# Patient Record
Sex: Female | Born: 1955 | Race: White | Hispanic: No | Marital: Single | Smoking: Current every day smoker
Health system: Southern US, Community
[De-identification: ages and names within clinical notes are randomized; demographics above are authoritative.]

## PROBLEM LIST (undated history)

## (undated) DIAGNOSIS — J449 Chronic obstructive pulmonary disease, unspecified: Secondary | ICD-10-CM

## (undated) DIAGNOSIS — J9611 Chronic respiratory failure with hypoxia: Secondary | ICD-10-CM

## (undated) DIAGNOSIS — I509 Heart failure, unspecified: Secondary | ICD-10-CM

---

## 2018-04-04 DIAGNOSIS — M272 Inflammatory conditions of jaws: Secondary | ICD-10-CM | POA: Diagnosis not present

## 2018-06-13 DIAGNOSIS — I10 Essential (primary) hypertension: Secondary | ICD-10-CM | POA: Diagnosis not present

## 2018-06-13 DIAGNOSIS — Z6822 Body mass index (BMI) 22.0-22.9, adult: Secondary | ICD-10-CM | POA: Diagnosis not present

## 2018-06-13 DIAGNOSIS — J449 Chronic obstructive pulmonary disease, unspecified: Secondary | ICD-10-CM | POA: Diagnosis not present

## 2018-06-13 DIAGNOSIS — F064 Anxiety disorder due to known physiological condition: Secondary | ICD-10-CM | POA: Diagnosis not present

## 2018-08-29 DIAGNOSIS — Z6821 Body mass index (BMI) 21.0-21.9, adult: Secondary | ICD-10-CM | POA: Diagnosis not present

## 2018-08-29 DIAGNOSIS — S0911XA Strain of muscle and tendon of head, initial encounter: Secondary | ICD-10-CM | POA: Diagnosis not present

## 2018-09-10 DIAGNOSIS — Z0000 Encounter for general adult medical examination without abnormal findings: Secondary | ICD-10-CM | POA: Diagnosis not present

## 2018-09-10 DIAGNOSIS — Z6822 Body mass index (BMI) 22.0-22.9, adult: Secondary | ICD-10-CM | POA: Diagnosis not present

## 2018-09-11 DIAGNOSIS — M25551 Pain in right hip: Secondary | ICD-10-CM | POA: Diagnosis not present

## 2018-09-21 DIAGNOSIS — R921 Mammographic calcification found on diagnostic imaging of breast: Secondary | ICD-10-CM | POA: Diagnosis not present

## 2018-09-21 DIAGNOSIS — Z1231 Encounter for screening mammogram for malignant neoplasm of breast: Secondary | ICD-10-CM | POA: Diagnosis not present

## 2018-10-03 DIAGNOSIS — R921 Mammographic calcification found on diagnostic imaging of breast: Secondary | ICD-10-CM | POA: Diagnosis not present

## 2018-10-03 DIAGNOSIS — R928 Other abnormal and inconclusive findings on diagnostic imaging of breast: Secondary | ICD-10-CM | POA: Diagnosis not present

## 2018-10-11 DIAGNOSIS — M818 Other osteoporosis without current pathological fracture: Secondary | ICD-10-CM | POA: Diagnosis not present

## 2018-10-11 DIAGNOSIS — M810 Age-related osteoporosis without current pathological fracture: Secondary | ICD-10-CM | POA: Diagnosis not present

## 2018-12-10 DIAGNOSIS — M818 Other osteoporosis without current pathological fracture: Secondary | ICD-10-CM | POA: Diagnosis not present

## 2018-12-10 DIAGNOSIS — J440 Chronic obstructive pulmonary disease with acute lower respiratory infection: Secondary | ICD-10-CM | POA: Diagnosis not present

## 2018-12-10 DIAGNOSIS — I10 Essential (primary) hypertension: Secondary | ICD-10-CM | POA: Diagnosis not present

## 2018-12-10 DIAGNOSIS — F064 Anxiety disorder due to known physiological condition: Secondary | ICD-10-CM | POA: Diagnosis not present

## 2019-02-13 DIAGNOSIS — J449 Chronic obstructive pulmonary disease, unspecified: Secondary | ICD-10-CM | POA: Diagnosis not present

## 2019-02-13 DIAGNOSIS — Z9981 Dependence on supplemental oxygen: Secondary | ICD-10-CM | POA: Diagnosis not present

## 2019-02-13 DIAGNOSIS — S2231XA Fracture of one rib, right side, initial encounter for closed fracture: Secondary | ICD-10-CM | POA: Diagnosis not present

## 2019-02-13 DIAGNOSIS — Z79899 Other long term (current) drug therapy: Secondary | ICD-10-CM | POA: Diagnosis not present

## 2019-02-13 DIAGNOSIS — Z79891 Long term (current) use of opiate analgesic: Secondary | ICD-10-CM | POA: Diagnosis not present

## 2019-02-13 DIAGNOSIS — J441 Chronic obstructive pulmonary disease with (acute) exacerbation: Secondary | ICD-10-CM | POA: Diagnosis not present

## 2019-02-13 DIAGNOSIS — Z87891 Personal history of nicotine dependence: Secondary | ICD-10-CM | POA: Diagnosis not present

## 2019-02-13 DIAGNOSIS — R05 Cough: Secondary | ICD-10-CM | POA: Diagnosis not present

## 2019-02-13 DIAGNOSIS — I10 Essential (primary) hypertension: Secondary | ICD-10-CM | POA: Diagnosis not present

## 2019-03-11 DIAGNOSIS — I10 Essential (primary) hypertension: Secondary | ICD-10-CM | POA: Diagnosis not present

## 2019-03-11 DIAGNOSIS — F064 Anxiety disorder due to known physiological condition: Secondary | ICD-10-CM | POA: Diagnosis not present

## 2019-03-11 DIAGNOSIS — J440 Chronic obstructive pulmonary disease with acute lower respiratory infection: Secondary | ICD-10-CM | POA: Diagnosis not present

## 2019-03-11 DIAGNOSIS — M818 Other osteoporosis without current pathological fracture: Secondary | ICD-10-CM | POA: Diagnosis not present

## 2019-06-10 DIAGNOSIS — Z6822 Body mass index (BMI) 22.0-22.9, adult: Secondary | ICD-10-CM | POA: Diagnosis not present

## 2019-06-10 DIAGNOSIS — M818 Other osteoporosis without current pathological fracture: Secondary | ICD-10-CM | POA: Diagnosis not present

## 2019-06-10 DIAGNOSIS — J449 Chronic obstructive pulmonary disease, unspecified: Secondary | ICD-10-CM | POA: Diagnosis not present

## 2019-06-10 DIAGNOSIS — I10 Essential (primary) hypertension: Secondary | ICD-10-CM | POA: Diagnosis not present

## 2019-06-10 DIAGNOSIS — F064 Anxiety disorder due to known physiological condition: Secondary | ICD-10-CM | POA: Diagnosis not present

## 2019-07-08 DIAGNOSIS — J029 Acute pharyngitis, unspecified: Secondary | ICD-10-CM | POA: Diagnosis not present

## 2019-07-08 DIAGNOSIS — Z6821 Body mass index (BMI) 21.0-21.9, adult: Secondary | ICD-10-CM | POA: Diagnosis not present

## 2019-07-19 DIAGNOSIS — S79912A Unspecified injury of left hip, initial encounter: Secondary | ICD-10-CM | POA: Diagnosis not present

## 2019-07-19 DIAGNOSIS — W010XXA Fall on same level from slipping, tripping and stumbling without subsequent striking against object, initial encounter: Secondary | ICD-10-CM | POA: Diagnosis not present

## 2019-07-19 DIAGNOSIS — J441 Chronic obstructive pulmonary disease with (acute) exacerbation: Secondary | ICD-10-CM | POA: Diagnosis not present

## 2019-07-19 DIAGNOSIS — S7002XA Contusion of left hip, initial encounter: Secondary | ICD-10-CM | POA: Diagnosis not present

## 2019-07-19 DIAGNOSIS — Z209 Contact with and (suspected) exposure to unspecified communicable disease: Secondary | ICD-10-CM | POA: Diagnosis not present

## 2019-07-19 DIAGNOSIS — Z87891 Personal history of nicotine dependence: Secondary | ICD-10-CM | POA: Diagnosis not present

## 2019-07-19 DIAGNOSIS — R52 Pain, unspecified: Secondary | ICD-10-CM | POA: Diagnosis not present

## 2019-07-19 DIAGNOSIS — M25552 Pain in left hip: Secondary | ICD-10-CM | POA: Diagnosis not present

## 2019-07-19 DIAGNOSIS — Z79899 Other long term (current) drug therapy: Secondary | ICD-10-CM | POA: Diagnosis not present

## 2019-07-19 DIAGNOSIS — I10 Essential (primary) hypertension: Secondary | ICD-10-CM | POA: Diagnosis not present

## 2019-07-19 DIAGNOSIS — R0902 Hypoxemia: Secondary | ICD-10-CM | POA: Diagnosis not present

## 2019-07-25 DIAGNOSIS — R6884 Jaw pain: Secondary | ICD-10-CM | POA: Diagnosis not present

## 2019-09-09 DIAGNOSIS — J449 Chronic obstructive pulmonary disease, unspecified: Secondary | ICD-10-CM | POA: Diagnosis not present

## 2019-09-09 DIAGNOSIS — Z6822 Body mass index (BMI) 22.0-22.9, adult: Secondary | ICD-10-CM | POA: Diagnosis not present

## 2019-09-09 DIAGNOSIS — F411 Generalized anxiety disorder: Secondary | ICD-10-CM | POA: Diagnosis not present

## 2019-09-09 DIAGNOSIS — E782 Mixed hyperlipidemia: Secondary | ICD-10-CM | POA: Diagnosis not present

## 2019-12-02 DIAGNOSIS — Z6821 Body mass index (BMI) 21.0-21.9, adult: Secondary | ICD-10-CM | POA: Diagnosis not present

## 2019-12-02 DIAGNOSIS — F411 Generalized anxiety disorder: Secondary | ICD-10-CM | POA: Diagnosis not present

## 2019-12-02 DIAGNOSIS — E7849 Other hyperlipidemia: Secondary | ICD-10-CM | POA: Diagnosis not present

## 2019-12-02 DIAGNOSIS — J449 Chronic obstructive pulmonary disease, unspecified: Secondary | ICD-10-CM | POA: Diagnosis not present

## 2020-09-03 ENCOUNTER — Other Ambulatory Visit (HOSPITAL_COMMUNITY): Payer: Self-pay | Admitting: Internal Medicine

## 2021-03-02 ENCOUNTER — Telehealth: Payer: Self-pay | Admitting: Internal Medicine

## 2021-03-02 NOTE — Telephone Encounter (Signed)
Cardiology Transfer Request/Teleconsult  Call received from Dr. Gorden Harms, [facility] ED 9:55pm, 03/02/21  65 year old with severe COPD with associated PH/cor pulmonale and chronic respiratory failure on 3L O2 continuous. She also is benzodiazapine dependant with a history of excessive use. Transported to the [facility] ED tonight with respiratory distress. Refused BiPAP but improved significantly en route with nebs and solumedrol. She had a second episode in the ED, significant respiratory distress with cyanosis, wheezing. Again responded well to nebs.   I am told her ECG had chronic changes consistent with right heart strain. She has not had any chest pain. A troponin was sent that was around 1000 initially and is down-trending. She had a pro-BNP of 54k, but I am told no JVD, no lower extremity edema, no pulmonary edema on CXR, and bilirubin is normal. Echo report from 01/21/21 reviewed which notes a severely dilated RV with moderately reduced systolic function, moderate TR and PR, mild PH, elevated RAP. She has been given a dose of IV lasix as well.   Her troponin is consistent with her acute respiratory failure presentation, particularly in the context of significant chronic right heart failure. Downtrending troponin without chest pain or ischemic ECG changes not consistent with ACS. She does not have other clinical evidence of decompensated right heart failure aside from her BNP, which is highly labile, currently within historical range. Additionally, her response to steroid/bronchodilator is not suggestive of a primary cardiac etiology for her presentation.    Recommend hospitalist admission for COPD exacerbation. Consider IV diuretics while admitted if clinical evidence of acute right heart failure/hypervolemia. Can also consider referral to pulmonary hypertension clinic at discharge for consideration of Tyvaso if no history of pulmonary edema, though unlikely to be of significant benefit at this  point given her degree of RV dysfunction.

## 2021-03-03 ENCOUNTER — Encounter (HOSPITAL_COMMUNITY): Payer: Self-pay | Admitting: Internal Medicine

## 2021-03-03 ENCOUNTER — Inpatient Hospital Stay (HOSPITAL_COMMUNITY)
Admission: EM | Admit: 2021-03-03 | Discharge: 2021-03-19 | DRG: 291 | Disposition: E | Payer: 59 | Source: Other Acute Inpatient Hospital | Attending: Pulmonary Disease | Admitting: Pulmonary Disease

## 2021-03-03 DIAGNOSIS — Z9981 Dependence on supplemental oxygen: Secondary | ICD-10-CM

## 2021-03-03 DIAGNOSIS — F17210 Nicotine dependence, cigarettes, uncomplicated: Secondary | ICD-10-CM | POA: Diagnosis present

## 2021-03-03 DIAGNOSIS — R230 Cyanosis: Secondary | ICD-10-CM | POA: Diagnosis not present

## 2021-03-03 DIAGNOSIS — E872 Acidosis: Secondary | ICD-10-CM | POA: Diagnosis present

## 2021-03-03 DIAGNOSIS — F1320 Sedative, hypnotic or anxiolytic dependence, uncomplicated: Secondary | ICD-10-CM | POA: Diagnosis present

## 2021-03-03 DIAGNOSIS — I2781 Cor pulmonale (chronic): Secondary | ICD-10-CM | POA: Diagnosis not present

## 2021-03-03 DIAGNOSIS — Z452 Encounter for adjustment and management of vascular access device: Secondary | ICD-10-CM

## 2021-03-03 DIAGNOSIS — Z515 Encounter for palliative care: Secondary | ICD-10-CM

## 2021-03-03 DIAGNOSIS — Z801 Family history of malignant neoplasm of trachea, bronchus and lung: Secondary | ICD-10-CM

## 2021-03-03 DIAGNOSIS — J9622 Acute and chronic respiratory failure with hypercapnia: Secondary | ICD-10-CM | POA: Diagnosis present

## 2021-03-03 DIAGNOSIS — I5033 Acute on chronic diastolic (congestive) heart failure: Secondary | ICD-10-CM | POA: Diagnosis present

## 2021-03-03 DIAGNOSIS — I472 Ventricular tachycardia: Secondary | ICD-10-CM | POA: Diagnosis not present

## 2021-03-03 DIAGNOSIS — R509 Fever, unspecified: Secondary | ICD-10-CM | POA: Diagnosis not present

## 2021-03-03 DIAGNOSIS — R319 Hematuria, unspecified: Secondary | ICD-10-CM | POA: Diagnosis not present

## 2021-03-03 DIAGNOSIS — J441 Chronic obstructive pulmonary disease with (acute) exacerbation: Secondary | ICD-10-CM | POA: Diagnosis not present

## 2021-03-03 DIAGNOSIS — R451 Restlessness and agitation: Secondary | ICD-10-CM | POA: Diagnosis not present

## 2021-03-03 DIAGNOSIS — G9341 Metabolic encephalopathy: Secondary | ICD-10-CM | POA: Diagnosis not present

## 2021-03-03 DIAGNOSIS — T380X5A Adverse effect of glucocorticoids and synthetic analogues, initial encounter: Secondary | ICD-10-CM | POA: Diagnosis not present

## 2021-03-03 DIAGNOSIS — I272 Pulmonary hypertension, unspecified: Secondary | ICD-10-CM | POA: Diagnosis present

## 2021-03-03 DIAGNOSIS — R4020 Unspecified coma: Secondary | ICD-10-CM | POA: Diagnosis not present

## 2021-03-03 DIAGNOSIS — Z79899 Other long term (current) drug therapy: Secondary | ICD-10-CM

## 2021-03-03 DIAGNOSIS — J9621 Acute and chronic respiratory failure with hypoxia: Secondary | ICD-10-CM | POA: Diagnosis present

## 2021-03-03 DIAGNOSIS — J9690 Respiratory failure, unspecified, unspecified whether with hypoxia or hypercapnia: Secondary | ICD-10-CM

## 2021-03-03 DIAGNOSIS — I110 Hypertensive heart disease with heart failure: Secondary | ICD-10-CM | POA: Diagnosis not present

## 2021-03-03 DIAGNOSIS — R339 Retention of urine, unspecified: Secondary | ICD-10-CM | POA: Diagnosis present

## 2021-03-03 DIAGNOSIS — J9611 Chronic respiratory failure with hypoxia: Secondary | ICD-10-CM | POA: Diagnosis not present

## 2021-03-03 DIAGNOSIS — R34 Anuria and oliguria: Secondary | ICD-10-CM | POA: Diagnosis not present

## 2021-03-03 DIAGNOSIS — R579 Shock, unspecified: Secondary | ICD-10-CM | POA: Diagnosis not present

## 2021-03-03 DIAGNOSIS — R739 Hyperglycemia, unspecified: Secondary | ICD-10-CM | POA: Diagnosis not present

## 2021-03-03 DIAGNOSIS — Z5320 Procedure and treatment not carried out because of patient's decision for unspecified reasons: Secondary | ICD-10-CM | POA: Diagnosis not present

## 2021-03-03 DIAGNOSIS — I248 Other forms of acute ischemic heart disease: Secondary | ICD-10-CM | POA: Diagnosis present

## 2021-03-03 DIAGNOSIS — E873 Alkalosis: Secondary | ICD-10-CM | POA: Diagnosis present

## 2021-03-03 DIAGNOSIS — I959 Hypotension, unspecified: Secondary | ICD-10-CM | POA: Diagnosis not present

## 2021-03-03 DIAGNOSIS — N179 Acute kidney failure, unspecified: Secondary | ICD-10-CM | POA: Diagnosis present

## 2021-03-03 DIAGNOSIS — Z66 Do not resuscitate: Secondary | ICD-10-CM | POA: Diagnosis not present

## 2021-03-03 DIAGNOSIS — I462 Cardiac arrest due to underlying cardiac condition: Secondary | ICD-10-CM | POA: Diagnosis not present

## 2021-03-03 DIAGNOSIS — Z0189 Encounter for other specified special examinations: Secondary | ICD-10-CM

## 2021-03-03 DIAGNOSIS — I5043 Acute on chronic combined systolic (congestive) and diastolic (congestive) heart failure: Secondary | ICD-10-CM | POA: Diagnosis present

## 2021-03-03 DIAGNOSIS — Z9289 Personal history of other medical treatment: Secondary | ICD-10-CM

## 2021-03-03 DIAGNOSIS — Z781 Physical restraint status: Secondary | ICD-10-CM

## 2021-03-03 DIAGNOSIS — J439 Emphysema, unspecified: Secondary | ICD-10-CM | POA: Diagnosis present

## 2021-03-03 DIAGNOSIS — R0902 Hypoxemia: Secondary | ICD-10-CM

## 2021-03-03 DIAGNOSIS — E785 Hyperlipidemia, unspecified: Secondary | ICD-10-CM | POA: Diagnosis present

## 2021-03-03 HISTORY — DX: Chronic obstructive pulmonary disease, unspecified: J44.9

## 2021-03-03 HISTORY — DX: Heart failure, unspecified: I50.9

## 2021-03-03 HISTORY — DX: Chronic respiratory failure with hypoxia: J96.11

## 2021-03-03 LAB — COMPREHENSIVE METABOLIC PANEL
ALT: 37 U/L (ref 0–44)
AST: 31 U/L (ref 15–41)
Albumin: 3.4 g/dL — ABNORMAL LOW (ref 3.5–5.0)
Alkaline Phosphatase: 202 U/L — ABNORMAL HIGH (ref 38–126)
Anion gap: 9 (ref 5–15)
BUN: 40 mg/dL — ABNORMAL HIGH (ref 8–23)
CO2: 36 mmol/L — ABNORMAL HIGH (ref 22–32)
Calcium: 8.6 mg/dL — ABNORMAL LOW (ref 8.9–10.3)
Chloride: 98 mmol/L (ref 98–111)
Creatinine, Ser: 1.25 mg/dL — ABNORMAL HIGH (ref 0.44–1.00)
GFR, Estimated: 48 mL/min — ABNORMAL LOW (ref >60)
Glucose, Bld: 101 mg/dL — ABNORMAL HIGH (ref 70–99)
Potassium: 3.7 mmol/L (ref 3.5–5.1)
Sodium: 143 mmol/L (ref 135–145)
Total Bilirubin: 1.1 mg/dL (ref 0.3–1.2)
Total Protein: 6.1 g/dL — ABNORMAL LOW (ref 6.5–8.1)

## 2021-03-03 LAB — CBC
HCT: 40.7 % (ref 36.0–46.0)
Hemoglobin: 11.9 g/dL — ABNORMAL LOW (ref 12.0–15.0)
MCH: 26.5 pg (ref 26.0–34.0)
MCHC: 29.2 g/dL — ABNORMAL LOW (ref 30.0–36.0)
MCV: 90.6 fL (ref 80.0–100.0)
Platelets: 247 10*3/uL (ref 150–400)
RBC: 4.49 MIL/uL (ref 3.87–5.11)
RDW: 22.5 % — ABNORMAL HIGH (ref 11.5–15.5)
WBC: 10.1 10*3/uL (ref 4.0–10.5)
nRBC: 0 % (ref 0.0–0.2)

## 2021-03-03 LAB — TROPONIN I (HIGH SENSITIVITY)
Troponin I (High Sensitivity): 720 ng/L (ref <18)
Troponin I (High Sensitivity): 679 ng/L (ref <18)

## 2021-03-03 LAB — MAGNESIUM: Magnesium: 2.1 mg/dL (ref 1.7–2.4)

## 2021-03-03 LAB — PHOSPHORUS: Phosphorus: 3.9 mg/dL (ref 2.5–4.6)

## 2021-03-03 LAB — BRAIN NATRIURETIC PEPTIDE: B Natriuretic Peptide: >4500 pg/mL — ABNORMAL HIGH (ref 0.0–100.0)

## 2021-03-03 MED ORDER — MOMETASONE FURO-FORMOTEROL FUM 200-5 MCG/ACT IN AERO
2.0000 | INHALATION_SPRAY | Freq: Two times a day (BID) | RESPIRATORY_TRACT | Status: DC
  Filled 2021-03-03: qty 8.8

## 2021-03-03 MED ORDER — UMECLIDINIUM BROMIDE 62.5 MCG/INH IN AEPB
1.0000 | INHALATION_SPRAY | Freq: Every day | RESPIRATORY_TRACT | Status: DC
  Filled 2021-03-03: qty 7

## 2021-03-03 MED ORDER — LEVALBUTEROL HCL 0.63 MG/3ML IN NEBU
0.6300 mg | INHALATION_SOLUTION | Freq: Four times a day (QID) | RESPIRATORY_TRACT | Status: DC | PRN
  Administered 2021-03-03 – 2021-03-08 (×2): 0.63 mg via RESPIRATORY_TRACT
  Filled 2021-03-03 (×2): qty 3

## 2021-03-03 MED ORDER — NICOTINE 21 MG/24HR TD PT24
21.0000 mg | MEDICATED_PATCH | Freq: Every day | TRANSDERMAL | Status: DC
  Administered 2021-03-03 – 2021-03-05 (×3): 21 mg via TRANSDERMAL
  Filled 2021-03-03 (×3): qty 1

## 2021-03-03 MED ORDER — THIAMINE HCL 100 MG PO TABS
100.0000 mg | ORAL_TABLET | Freq: Every day | ORAL | Status: DC
  Administered 2021-03-03 – 2021-03-05 (×2): 100 mg via ORAL
  Filled 2021-03-03 (×2): qty 1

## 2021-03-03 MED ORDER — PREDNISONE 20 MG PO TABS: 40.0000 mg | ORAL_TABLET | Freq: Every day | ORAL | Status: DC

## 2021-03-03 MED ORDER — ADULT MULTIVITAMIN W/MINERALS CH
1.0000 | ORAL_TABLET | Freq: Every day | ORAL | Status: DC
  Administered 2021-03-03 – 2021-03-05 (×2): 1 via ORAL
  Filled 2021-03-03 (×2): qty 1

## 2021-03-03 MED ORDER — LORAZEPAM 2 MG/ML IJ SOLN
1.0000 mg | INTRAMUSCULAR | Status: DC | PRN
  Administered 2021-03-04: 1 mg via INTRAVENOUS
  Administered 2021-03-04: 2 mg via INTRAVENOUS
  Administered 2021-03-04: 0.5 mg via INTRAVENOUS
  Administered 2021-03-04: 4 mg via INTRAVENOUS
  Filled 2021-03-03 (×3): qty 1
  Filled 2021-03-03: qty 2

## 2021-03-03 MED ORDER — THIAMINE HCL 100 MG/ML IJ SOLN: 100.0000 mg | Freq: Every day | INTRAMUSCULAR | Status: DC

## 2021-03-03 MED ORDER — FOLIC ACID 1 MG PO TABS
1.0000 mg | ORAL_TABLET | Freq: Every day | ORAL | Status: DC
  Administered 2021-03-03 – 2021-03-05 (×2): 1 mg via ORAL
  Filled 2021-03-03 (×2): qty 1

## 2021-03-03 MED ORDER — FUROSEMIDE 10 MG/ML IJ SOLN: 40.0000 mg | Freq: Every day | INTRAMUSCULAR | Status: DC

## 2021-03-03 MED ORDER — LORAZEPAM 1 MG PO TABS
1.0000 mg | ORAL_TABLET | ORAL | Status: DC | PRN
  Administered 2021-03-03: 1 mg via ORAL
  Filled 2021-03-03: qty 2

## 2021-03-03 MED ORDER — ENOXAPARIN SODIUM 40 MG/0.4ML ~~LOC~~ SOLN
40.0000 mg | SUBCUTANEOUS | Status: DC
  Administered 2021-03-03: 40 mg via SUBCUTANEOUS
  Filled 2021-03-03: qty 0.4

## 2021-03-03 NOTE — H&P (Signed)
History and Physical    Cassandra Riley RFX:[id number] DOB: 1956-04-03 DOA: 02/23/2021  PCP: Neale Burly, MD  Patient coming from: [facility] transfer  I have personally briefly reviewed patient's old medical records in [place]  Chief Complaint: Respiratory distress  HPI: Cassandra Riley is a 65 y.o. 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  Most recent 2d echo was 01/21/2021.  Pt had quit smoking in 2017, but resumed smoking ~3 months ago as well as abusing benzos after family member deaths.  Pt presented [facility]/15 to [facility] with respiratory distress.  Reportedly refused BIPAP but improved significantly with nebs and solumedrol.  Had second episode in ED that responded to nebs.  Pt also found to have CHF decompensation with ProBNP of 54k, no JVD, no extremity edema, did have pulm edema on CXR.  Given 45m IV lasix.  Initial HS trop of 1k, down trending with repeats in the 900s.  No CP.  [facility] with T wave findings.  [facility] as well as our cards fellow) were less impressed for primary cardiac event.  Felt that EKGs and trop elevations were c/w respiratory disease.  Neither cards fellow recd a heparin gtt.  [facility] of [facility] if needed[facility]  [facility] just slight chest tightness, breathing overall is significantly improved compared to last night.  Has foley in place: apparently has had urinary retention prior to hospital presentation for a couple of days.  No respiratory distress at this time, is on her baseline 3L.   Review of Systems: As per HPI, otherwise all review of systems negative.  Past Medical History:  Diagnosis Date  . CHF (congestive heart failure) ([place]   . Chronic respiratory failure with  hypoxia (HCC)    3L  . COPD (chronic obstructive pulmonary disease) ([place]     History reviewed. No pertinent surgical history.   reports that she has been smoking cigarettes. She does not have any smokeless tobacco history on file. She reports current drug use. Drug: Benzodiazepines. She reports that she does not drink alcohol.  Allergies  Allergen Reactions  . Morphine And Related Nausea And Vomiting    Family History  Problem Relation Age of Onset  . Lung cancer Sister      Prior to Admission medications   Not on File    Physical Exam: Vitals:   02/28/2021 1947  BP: 112/77  Pulse: (!) 113  Resp: 16  Temp: 98.6 F (37 C)  TempSrc: Oral  SpO2: 95%  Weight: 50.5 kg  Height: _0  (1.575 m)    Constitutional: NAD, calm, comfortable Eyes: PERRL, lids and conjunctivae normal ENMT: Mucous membranes are moist. Posterior pharynx clear of any exudate or lesions.Normal dentition.  Neck: normal, supple, no masses, no thyromegaly Respiratory: Coarse BS Cardiovascular: Tachycardic Abdomen: no tenderness, no masses palpated. No hepatosplenomegaly. Bowel sounds positive.  Musculoskeletal: no clubbing / cyanosis. No joint deformity upper and lower extremities. Good ROM, no contractures. Normal muscle tone.  Skin: no rashes, lesions, ulcers. No induration Neurologic: CN 2-12 grossly intact. Sensation intact, DTR normal. Strength 5/5 in all 4.  Psychiatric: Normal judgment and insight. Alert and oriented x 3. Normal mood.    Labs on Admission: I have personally reviewed following  labs and imaging studies  CBC: No results for input(s): WBC, NEUTROABS, HGB, HCT, MCV, PLT in the last 168 hours. Basic Metabolic Panel: No results for input(s): NA, K, CL, CO2, GLUCOSE, BUN, CREATININE, CALCIUM, MG, PHOS in the last 168 hours. GFR: CrCl cannot be calculated (No successful lab value found.). Liver Function Tests: No results for input(s): AST, ALT, ALKPHOS, BILITOT, PROT, ALBUMIN in  the last 168 hours. No results for input(s): LIPASE, AMYLASE in the last 168 hours. No results for input(s): AMMONIA in the last 168 hours. Coagulation Profile: No results for input(s): INR, PROTIME in the last 168 hours. Cardiac Enzymes: No results for input(s): CKTOTAL, CKMB, CKMBINDEX, TROPONINI in the last 168 hours. BNP (last 3 results) No results for input(s): PROBNP in the last 8760 hours. HbA1C: No results for input(s): HGBA1C in the last 72 hours. CBG: No results for input(s): GLUCAP in the last 168 hours. Lipid Profile: No results for input(s): CHOL, HDL, LDLCALC, TRIG, CHOLHDL, LDLDIRECT in the last 72 hours. Thyroid Function Tests: No results for input(s): TSH, T4TOTAL, FREET4, T3FREE, THYROIDAB in the last 72 hours. Anemia Panel: No results for input(s): VITAMINB12, FOLATE, FERRITIN, TIBC, IRON, RETICCTPCT in the last 72 hours. Urine analysis: No results found for: COLORURINE, APPEARANCEUR, LABSPEC, PHURINE, GLUCOSEU, HGBUR, BILIRUBINUR, KETONESUR, PROTEINUR, UROBILINOGEN, NITRITE, LEUKOCYTESUR  Radiological Exams on Admission: No results found.  EKG: Independently reviewed.  Assessment/Plan Principal Problem:   Acute on chronic diastolic CHF (congestive heart failure) (HCC) Active Problems:   Chronic respiratory failure with hypoxia (HCC)   COPD with acute exacerbation ([place])    1. Acute on chronic diastolic and R sided CHF - 1. CHF pathway 2. Lasix 39m IV daily for the moment 3. Tele monitor 4. Checking serial trops 5. Message sent to P. Trent for cards eval in AM 6. [facility], sounded less impressed for primary cardiac event (type 1 MI). 1. Neither cards fellow recommended heparin gtt 7. BMP daily 2. COPD with acute exacerbation - 1. COPD pathway 2. Prednisone 3. LABA/LAMA/inh steroid 4. Emphasized smoking cessation 5. PRN SABA (try xopenex because she already has S.Tach). 3. Chronic respiratory failure with  hypoxia and hypercapnea - 1. Cont 3L O2 via [place] 4. Benzodiazepine dependence / abuse - 1. CIWA to prevent withdrawal while here  DVT prophylaxis: Lovenox Code Status: Full Family Communication: No family in room Disposition Plan: Home after breathing improved and cleared by cards Consults called: Message sent to P. Trent for cards eval in AM [place]   Yulanda Diggs, JMedicine ParkHospitalists  How to contact the TCoast Surgery CenterAttending or Consulting provider [place] covering provider during after hours [place] for this patient?  1. Check the care team in [facility] Centerand look for a) attending/consulting TRH provider listed and b) the TAslaska Surgery Centerteam listed 2. Log into [email or web address]  Amion Physician Scheduling and messaging for groups and whole hospitals  On call and physician scheduling software for group practices, residents, hospitalists and other medical providers for call, clinic, rotation and shift schedules. OnCall Enterprise is a hospital-wide system for scheduling doctors and paging doctors on call. EasyPlot is for scientific plotting and data analysis.  [email or web address]  and use [place]'s universal password to access. If you do not have the password, please contact the hospital operator.  3. Locate the TNorth Georgia Medical Centerprovider you are looking for under [facility] and page to a number that you can be  directly reached. 4. If you still have difficulty reaching the provider, please page the [facility] (Director on Call) for the Hospitalists listed on amion for assistance.  02/20/2021, 9:07 PM

## 2021-03-04 ENCOUNTER — Observation Stay (HOSPITAL_COMMUNITY): Payer: 59

## 2021-03-04 ENCOUNTER — Inpatient Hospital Stay (HOSPITAL_COMMUNITY): Payer: 59

## 2021-03-04 DIAGNOSIS — I5043 Acute on chronic combined systolic (congestive) and diastolic (congestive) heart failure: Secondary | ICD-10-CM | POA: Diagnosis present

## 2021-03-04 DIAGNOSIS — I2781 Cor pulmonale (chronic): Secondary | ICD-10-CM | POA: Diagnosis present

## 2021-03-04 DIAGNOSIS — I462 Cardiac arrest due to underlying cardiac condition: Secondary | ICD-10-CM | POA: Diagnosis not present

## 2021-03-04 DIAGNOSIS — R34 Anuria and oliguria: Secondary | ICD-10-CM | POA: Diagnosis not present

## 2021-03-04 DIAGNOSIS — N179 Acute kidney failure, unspecified: Secondary | ICD-10-CM | POA: Diagnosis present

## 2021-03-04 DIAGNOSIS — I472 Ventricular tachycardia: Secondary | ICD-10-CM | POA: Diagnosis not present

## 2021-03-04 DIAGNOSIS — J439 Emphysema, unspecified: Secondary | ICD-10-CM | POA: Diagnosis present

## 2021-03-04 DIAGNOSIS — Z66 Do not resuscitate: Secondary | ICD-10-CM | POA: Diagnosis not present

## 2021-03-04 DIAGNOSIS — I2609 Other pulmonary embolism with acute cor pulmonale: Secondary | ICD-10-CM | POA: Diagnosis not present

## 2021-03-04 DIAGNOSIS — F1320 Sedative, hypnotic or anxiolytic dependence, uncomplicated: Secondary | ICD-10-CM | POA: Diagnosis present

## 2021-03-04 DIAGNOSIS — R509 Fever, unspecified: Secondary | ICD-10-CM | POA: Diagnosis not present

## 2021-03-04 DIAGNOSIS — R9431 Abnormal electrocardiogram [ECG] [EKG]: Secondary | ICD-10-CM

## 2021-03-04 DIAGNOSIS — Z9981 Dependence on supplemental oxygen: Secondary | ICD-10-CM | POA: Diagnosis not present

## 2021-03-04 DIAGNOSIS — R451 Restlessness and agitation: Secondary | ICD-10-CM | POA: Diagnosis not present

## 2021-03-04 DIAGNOSIS — Z7189 Other specified counseling: Secondary | ICD-10-CM | POA: Diagnosis not present

## 2021-03-04 DIAGNOSIS — I5021 Acute systolic (congestive) heart failure: Secondary | ICD-10-CM | POA: Diagnosis not present

## 2021-03-04 DIAGNOSIS — G9341 Metabolic encephalopathy: Secondary | ICD-10-CM

## 2021-03-04 DIAGNOSIS — J9611 Chronic respiratory failure with hypoxia: Secondary | ICD-10-CM | POA: Diagnosis not present

## 2021-03-04 DIAGNOSIS — Z515 Encounter for palliative care: Secondary | ICD-10-CM | POA: Diagnosis not present

## 2021-03-04 DIAGNOSIS — I272 Pulmonary hypertension, unspecified: Secondary | ICD-10-CM | POA: Diagnosis present

## 2021-03-04 DIAGNOSIS — I110 Hypertensive heart disease with heart failure: Secondary | ICD-10-CM | POA: Diagnosis present

## 2021-03-04 DIAGNOSIS — I248 Other forms of acute ischemic heart disease: Secondary | ICD-10-CM | POA: Diagnosis present

## 2021-03-04 DIAGNOSIS — I5033 Acute on chronic diastolic (congestive) heart failure: Secondary | ICD-10-CM | POA: Diagnosis present

## 2021-03-04 DIAGNOSIS — R579 Shock, unspecified: Secondary | ICD-10-CM | POA: Diagnosis not present

## 2021-03-04 DIAGNOSIS — E872 Acidosis: Secondary | ICD-10-CM | POA: Diagnosis present

## 2021-03-04 DIAGNOSIS — J441 Chronic obstructive pulmonary disease with (acute) exacerbation: Secondary | ICD-10-CM | POA: Diagnosis not present

## 2021-03-04 DIAGNOSIS — E873 Alkalosis: Secondary | ICD-10-CM | POA: Diagnosis present

## 2021-03-04 DIAGNOSIS — R0902 Hypoxemia: Secondary | ICD-10-CM

## 2021-03-04 DIAGNOSIS — R4020 Unspecified coma: Secondary | ICD-10-CM | POA: Diagnosis present

## 2021-03-04 DIAGNOSIS — J9622 Acute and chronic respiratory failure with hypercapnia: Secondary | ICD-10-CM | POA: Diagnosis present

## 2021-03-04 DIAGNOSIS — R230 Cyanosis: Secondary | ICD-10-CM | POA: Diagnosis not present

## 2021-03-04 DIAGNOSIS — J9621 Acute and chronic respiratory failure with hypoxia: Secondary | ICD-10-CM | POA: Diagnosis present

## 2021-03-04 LAB — BLOOD GAS, VENOUS
Acid-Base Excess: 11.1 mmol/L — ABNORMAL HIGH (ref 0.0–2.0)
Bicarbonate: 38.9 mmol/L — ABNORMAL HIGH (ref 20.0–28.0)
FIO2: 100
O2 Saturation: 96.4 %
Patient temperature: 37
pCO2, Ven: 101 mmHg (ref 44.0–60.0)
pH, Ven: 7.211 — ABNORMAL LOW (ref 7.250–7.430)

## 2021-03-04 LAB — ECHOCARDIOGRAM COMPLETE
AR max vel: 2.56 cm2
AV Area VTI: 2.64 cm2
AV Area mean vel: 2.07 cm2
AV Mean grad: 3 mmHg
AV Peak grad: 4.3 mmHg
Ao pk vel: 1.04 m/s
Area-P 1/2: 2.76 cm2
Calc EF: 37.9 %
Height: 62 in
S' Lateral: 3.8 cm
Single Plane A2C EF: 35.6 %
Single Plane A4C EF: 36.2 %
Weight: 1791.9 oz

## 2021-03-04 LAB — POCT I-STAT 7, (LYTES, BLD GAS, ICA,H+H)
Acid-Base Excess: 20 mmol/L — ABNORMAL HIGH (ref 0.0–2.0)
Bicarbonate: 46.3 mmol/L — ABNORMAL HIGH (ref 20.0–28.0)
Calcium, Ion: 1.11 mmol/L — ABNORMAL LOW (ref 1.15–1.40)
HCT: 41 % (ref 36.0–46.0)
Hemoglobin: 13.9 g/dL (ref 12.0–15.0)
O2 Saturation: 99 %
Patient temperature: 96.1
Potassium: 3.5 mmol/L (ref 3.5–5.1)
Sodium: 140 mmol/L (ref 135–145)
TCO2: 48 mmol/L — ABNORMAL HIGH (ref 22–32)
pCO2 arterial: 56 mmHg — ABNORMAL HIGH (ref 32.0–48.0)
pH, Arterial: 7.52 — ABNORMAL HIGH (ref 7.350–7.450)
pO2, Arterial: 126 mmHg — ABNORMAL HIGH (ref 83.0–108.0)

## 2021-03-04 LAB — BLOOD GAS, ARTERIAL
Acid-Base Excess: 11 mmol/L — ABNORMAL HIGH (ref 0.0–2.0)
Acid-Base Excess: 15.1 mmol/L — ABNORMAL HIGH (ref 0.0–2.0)
Acid-Base Excess: 16.9 mmol/L — ABNORMAL HIGH (ref 0.0–2.0)
Bicarbonate: 38.2 mmol/L — ABNORMAL HIGH (ref 20.0–28.0)
Bicarbonate: 41.9 mmol/L — ABNORMAL HIGH (ref 20.0–28.0)
Bicarbonate: 43.2 mmol/L — ABNORMAL HIGH (ref 20.0–28.0)
Drawn by: 54887
FIO2: 100
FIO2: 60
FIO2: 40
O2 Saturation: 98.3 %
O2 Saturation: 99 %
O2 Saturation: 99.3 %
Patient temperature: 37
Patient temperature: 36.5
Patient temperature: 37
pCO2 arterial: 90.1 mmHg (ref 32.0–48.0)
pCO2 arterial: 83.4 mmHg (ref 32.0–48.0)
pCO2 arterial: 78.2 mmHg (ref 32.0–48.0)
pH, Arterial: 7.251 — ABNORMAL LOW (ref 7.350–7.450)
pH, Arterial: 7.318 — ABNORMAL LOW (ref 7.350–7.450)
pH, Arterial: 7.362 (ref 7.350–7.450)
pO2, Arterial: 145 mmHg — ABNORMAL HIGH (ref 83.0–108.0)
pO2, Arterial: 180 mmHg — ABNORMAL HIGH (ref 83.0–108.0)
pO2, Arterial: 190 mmHg — ABNORMAL HIGH (ref 83.0–108.0)

## 2021-03-04 LAB — BASIC METABOLIC PANEL
Anion gap: 10 (ref 5–15)
BUN: 41 mg/dL — ABNORMAL HIGH (ref 8–23)
CO2: 37 mmol/L — ABNORMAL HIGH (ref 22–32)
Calcium: 8.7 mg/dL — ABNORMAL LOW (ref 8.9–10.3)
Chloride: 96 mmol/L — ABNORMAL LOW (ref 98–111)
Creatinine, Ser: 1.23 mg/dL — ABNORMAL HIGH (ref 0.44–1.00)
GFR, Estimated: 49 mL/min — ABNORMAL LOW (ref >60)
Glucose, Bld: 145 mg/dL — ABNORMAL HIGH (ref 70–99)
Potassium: 4.2 mmol/L (ref 3.5–5.1)
Sodium: 143 mmol/L (ref 135–145)

## 2021-03-04 LAB — URINALYSIS, ROUTINE W REFLEX MICROSCOPIC
Bacteria, UA: NONE SEEN
Bilirubin Urine: NEGATIVE
Glucose, UA: NEGATIVE mg/dL
Ketones, ur: 5 mg/dL — AB
Leukocytes,Ua: NEGATIVE
Nitrite: NEGATIVE
Protein, ur: 100 mg/dL — AB
RBC / HPF: >50 RBC/hpf — ABNORMAL HIGH (ref 0–5)
Specific Gravity, Urine: 1.013 (ref 1.005–1.030)
pH: 7 (ref 5.0–8.0)

## 2021-03-04 LAB — HIV ANTIBODY (ROUTINE TESTING W REFLEX): HIV Screen 4th Generation wRfx: NONREACTIVE

## 2021-03-04 LAB — AMMONIA: Ammonia: 60 umol/L — ABNORMAL HIGH (ref 9–35)

## 2021-03-04 LAB — LACTIC ACID, PLASMA
Lactic Acid, Venous: 1.2 mmol/L (ref 0.5–1.9)
Lactic Acid, Venous: 1.1 mmol/L (ref 0.5–1.9)

## 2021-03-04 LAB — GLUCOSE, CAPILLARY
Glucose-Capillary: 89 mg/dL (ref 70–99)
Glucose-Capillary: 85 mg/dL (ref 70–99)

## 2021-03-04 MED ORDER — PANTOPRAZOLE SODIUM 20 MG PO TBEC: 20.0000 mg | DELAYED_RELEASE_TABLET | Freq: Every day | ORAL | Status: DC

## 2021-03-04 MED ORDER — HEPARIN SODIUM (PORCINE) 5000 UNIT/ML IJ SOLN
5000.0000 [IU] | Freq: Three times a day (TID) | INTRAMUSCULAR | Status: DC
  Administered 2021-03-04 – 2021-03-06 (×6): 5000 [IU] via SUBCUTANEOUS
  Filled 2021-03-04 (×6): qty 1

## 2021-03-04 MED ORDER — NOREPINEPHRINE 4 MG/250ML-% IV SOLN
INTRAVENOUS | Status: AC
  Administered 2021-03-04: 4 ug/min via INTRAVENOUS
  Filled 2021-03-04: qty 250

## 2021-03-04 MED ORDER — CHLORHEXIDINE GLUCONATE CLOTH 2 % EX PADS
6.0000 | MEDICATED_PAD | Freq: Every day | CUTANEOUS | Status: DC
  Administered 2021-03-04 – 2021-03-07 (×5): 6 via TOPICAL

## 2021-03-04 MED ORDER — ROCURONIUM BROMIDE 10 MG/ML (PF) SYRINGE
PREFILLED_SYRINGE | INTRAVENOUS | Status: AC
  Administered 2021-03-04: 50 mg
  Filled 2021-03-04: qty 10

## 2021-03-04 MED ORDER — FENTANYL CITRATE (PF) 100 MCG/2ML IJ SOLN: 50.0000 ug | Freq: Once | INTRAMUSCULAR | Status: AC

## 2021-03-04 MED ORDER — ALBUTEROL SULFATE (2.5 MG/3ML) 0.083% IN NEBU
2.5000 mg | INHALATION_SOLUTION | Freq: Four times a day (QID) | RESPIRATORY_TRACT | Status: DC
  Administered 2021-03-04 (×3): 2.5 mg via RESPIRATORY_TRACT
  Filled 2021-03-04 (×3): qty 3

## 2021-03-04 MED ORDER — FUROSEMIDE 10 MG/ML IJ SOLN
40.0000 mg | Freq: Every day | INTRAMUSCULAR | Status: DC
  Administered 2021-03-04: 40 mg via INTRAVENOUS
  Filled 2021-03-04: qty 4

## 2021-03-04 MED ORDER — SODIUM CHLORIDE 0.9 % IV SOLN
500.0000 mg | INTRAVENOUS | Status: AC
  Administered 2021-03-04 – 2021-03-06 (×3): 500 mg via INTRAVENOUS
  Filled 2021-03-04 (×3): qty 500

## 2021-03-04 MED ORDER — LACTATED RINGERS IV SOLN: INTRAVENOUS | Status: AC

## 2021-03-04 MED ORDER — PROPOFOL 1000 MG/100ML IV EMUL: 5.0000 ug/kg/min | INTRAVENOUS | Status: DC

## 2021-03-04 MED ORDER — NOREPINEPHRINE 4 MG/250ML-% IV SOLN
2.0000 ug/min | INTRAVENOUS | Status: DC
  Administered 2021-03-04 – 2021-03-07 (×3): 2 ug/min via INTRAVENOUS
  Administered 2021-03-08: 6 ug/min via INTRAVENOUS
  Filled 2021-03-04 (×3): qty 250

## 2021-03-04 MED ORDER — OXYBUTYNIN CHLORIDE 5 MG PO TABS: 5.0000 mg | ORAL_TABLET | Freq: Three times a day (TID) | ORAL | Status: DC

## 2021-03-04 MED ORDER — FENTANYL BOLUS VIA INFUSION
50.0000 ug | INTRAVENOUS | Status: DC | PRN
  Administered 2021-03-05 – 2021-03-06 (×4): 50 ug via INTRAVENOUS
  Filled 2021-03-04: qty 50

## 2021-03-04 MED ORDER — DOCUSATE SODIUM 50 MG/5ML PO LIQD
100.0000 mg | Freq: Two times a day (BID) | ORAL | Status: DC
  Administered 2021-03-04 – 2021-03-05 (×2): 100 mg
  Filled 2021-03-04 (×2): qty 10

## 2021-03-04 MED ORDER — ARFORMOTEROL TARTRATE 15 MCG/2ML IN NEBU
15.0000 ug | INHALATION_SOLUTION | Freq: Two times a day (BID) | RESPIRATORY_TRACT | Status: DC
  Administered 2021-03-04 – 2021-03-08 (×8): 15 ug via RESPIRATORY_TRACT
  Filled 2021-03-04 (×8): qty 2

## 2021-03-04 MED ORDER — MIDAZOLAM HCL 2 MG/2ML IJ SOLN
INTRAMUSCULAR | Status: AC
  Filled 2021-03-04: qty 2

## 2021-03-04 MED ORDER — DEXMEDETOMIDINE HCL IN NACL 400 MCG/100ML IV SOLN
0.0000 ug/kg/h | INTRAVENOUS | Status: DC
  Administered 2021-03-04 – 2021-03-05 (×2): 0.7 ug/kg/h via INTRAVENOUS
  Administered 2021-03-05: 0.4 ug/kg/h via INTRAVENOUS
  Administered 2021-03-06 (×3): 1 ug/kg/h via INTRAVENOUS
  Administered 2021-03-07: 1.2 ug/kg/h via INTRAVENOUS
  Filled 2021-03-04 (×6): qty 100

## 2021-03-04 MED ORDER — ORAL CARE MOUTH RINSE
15.0000 mL | OROMUCOSAL | Status: DC
  Administered 2021-03-04 – 2021-03-08 (×33): 15 mL via OROMUCOSAL

## 2021-03-04 MED ORDER — SODIUM CHLORIDE 0.9 % IV SOLN: 250.0000 mL | INTRAVENOUS | Status: DC

## 2021-03-04 MED ORDER — ETOMIDATE 2 MG/ML IV SOLN
INTRAVENOUS | Status: AC
  Administered 2021-03-04: 9 mg
  Filled 2021-03-04: qty 20

## 2021-03-04 MED ORDER — DEXMEDETOMIDINE HCL IN NACL 400 MCG/100ML IV SOLN
0.4000 ug/kg/h | INTRAVENOUS | Status: DC
  Administered 2021-03-04: 0.7 ug/kg/h via INTRAVENOUS
  Filled 2021-03-04: qty 100

## 2021-03-04 MED ORDER — METHYLPREDNISOLONE SODIUM SUCC 40 MG IJ SOLR
20.0000 mg | Freq: Every day | INTRAMUSCULAR | Status: DC
  Administered 2021-03-04 – 2021-03-05 (×2): 20 mg via INTRAVENOUS
  Filled 2021-03-04 (×2): qty 1

## 2021-03-04 MED ORDER — CHLORHEXIDINE GLUCONATE 0.12% ORAL RINSE (MEDLINE KIT)
15.0000 mL | Freq: Two times a day (BID) | OROMUCOSAL | Status: DC
  Administered 2021-03-04 – 2021-03-07 (×7): 15 mL via OROMUCOSAL

## 2021-03-04 MED ORDER — MIDODRINE HCL 5 MG PO TABS: 5.0000 mg | ORAL_TABLET | Freq: Three times a day (TID) | ORAL | Status: DC

## 2021-03-04 MED ORDER — PHENYLEPHRINE 40 MCG/ML (10ML) SYRINGE FOR IV PUSH (FOR BLOOD PRESSURE SUPPORT)
PREFILLED_SYRINGE | INTRAVENOUS | Status: AC
  Filled 2021-03-04: qty 20

## 2021-03-04 MED ORDER — HALOPERIDOL LACTATE 5 MG/ML IJ SOLN
1.0000 mg | Freq: Once | INTRAMUSCULAR | Status: AC
  Administered 2021-03-04: 1 mg via INTRAVENOUS

## 2021-03-04 MED ORDER — LACTULOSE 10 GM/15ML PO SOLN
30.0000 g | Freq: Three times a day (TID) | ORAL | Status: DC
  Administered 2021-03-04 – 2021-03-05 (×2): 30 g via ORAL
  Filled 2021-03-04 (×2): qty 45

## 2021-03-04 MED ORDER — HALOPERIDOL LACTATE 5 MG/ML IJ SOLN
INTRAMUSCULAR | Status: AC
  Filled 2021-03-04: qty 1

## 2021-03-04 MED ORDER — BUDESONIDE 0.5 MG/2ML IN SUSP
0.5000 mg | Freq: Two times a day (BID) | RESPIRATORY_TRACT | Status: DC
  Administered 2021-03-04 – 2021-03-08 (×8): 0.5 mg via RESPIRATORY_TRACT
  Filled 2021-03-04 (×8): qty 2

## 2021-03-04 MED ORDER — FUROSEMIDE 10 MG/ML IJ SOLN
40.0000 mg | Freq: Two times a day (BID) | INTRAMUSCULAR | Status: DC
  Filled 2021-03-04: qty 4

## 2021-03-04 MED ORDER — MIDAZOLAM HCL 2 MG/2ML IJ SOLN
INTRAMUSCULAR | Status: AC
  Administered 2021-03-04: 2 mg
  Filled 2021-03-04: qty 2

## 2021-03-04 MED ORDER — BUDESONIDE 0.25 MG/2ML IN SUSP: 0.2500 mg | Freq: Two times a day (BID) | RESPIRATORY_TRACT | Status: DC

## 2021-03-04 MED ORDER — OXYBUTYNIN CHLORIDE 5 MG PO TABS
5.0000 mg | ORAL_TABLET | Freq: Three times a day (TID) | ORAL | Status: DC
  Administered 2021-03-04 – 2021-03-05 (×4): 5 mg via ORAL
  Filled 2021-03-04 (×4): qty 1

## 2021-03-04 MED ORDER — FENTANYL CITRATE (PF) 100 MCG/2ML IJ SOLN
INTRAMUSCULAR | Status: AC
  Administered 2021-03-04: 100 ug via INTRAVENOUS
  Filled 2021-03-04: qty 2

## 2021-03-04 MED ORDER — FENTANYL 2500MCG IN NS 250ML (10MCG/ML) PREMIX INFUSION
50.0000 ug/h | INTRAVENOUS | Status: DC
  Administered 2021-03-04 – 2021-03-05 (×2): 50 ug/h via INTRAVENOUS
  Administered 2021-03-06: 100 ug/h via INTRAVENOUS
  Filled 2021-03-04 (×3): qty 250

## 2021-03-04 MED ORDER — PANTOPRAZOLE SODIUM 40 MG PO PACK
40.0000 mg | PACK | Freq: Every day | ORAL | Status: DC
  Administered 2021-03-04 – 2021-03-08 (×5): 40 mg
  Filled 2021-03-04 (×5): qty 20

## 2021-03-04 MED ORDER — POLYETHYLENE GLYCOL 3350 17 G PO PACK
17.0000 g | PACK | Freq: Every day | ORAL | Status: DC
  Administered 2021-03-04 – 2021-03-05 (×2): 17 g
  Filled 2021-03-04 (×2): qty 1

## 2021-03-04 MED ORDER — LACTATED RINGERS IV BOLUS
500.0000 mL | Freq: Once | INTRAVENOUS | Status: AC
  Administered 2021-03-04: 500 mL via INTRAVENOUS

## 2021-03-04 NOTE — Progress Notes (Signed)
PT Cancellation Note  Patient Details Name: Karlin Binion MRN: [id number] DOB: 1956/04/24   Cancelled Treatment:    Reason Eval/Treat Not Completed: Patient not medically ready, unable to come off BiPAP at this time. Will follow-up for PT Evaluation as appropriate.  Mabeline Caras, PT, DPT Acute Rehabilitation Services  Pager [phone] Office [place] 03/04/2021, 8:26 AM

## 2021-03-04 NOTE — Progress Notes (Signed)
   03/04/21 0008  BiPAP/CPAP/SIPAP  $ Non-Invasive Ventilator  Non-Invasive Vent Set Up  $ Non-Invasive Home Ventilator  Initial  BiPAP/CPAP/SIPAP Pt Type Adult  Mask Type Full face mask  Mask Size Small  Set Rate 16 breaths/min  Respiratory Rate 18 breaths/min  IPAP 16 cmH20  EPAP 8 cmH2O  Oxygen Percent 100 %  Minute Ventilation 7  Leak 0  Peak Inspiratory Pressure (PIP) 16  Tidal Volume (Vt) 390  BiPAP/CPAP/SIPAP BiPAP  Patient Home Equipment No  Auto Titrate No  Press High Alarm 25 cmH2O  Press Low Alarm 5 cmH2O  BiPAP/CPAP /SiPAP Vitals  Pulse Rate (!) 122  Resp 18  SpO2 97 %  Bilateral Breath Sounds Expiratory wheezes  MEWS Score/Color  MEWS Score 2  MEWS Score Color Yellow  Placed pt. On bipap due to respiratory distress

## 2021-03-04 NOTE — Progress Notes (Signed)
Patient has pulled off her BIPAP mask twice and electrodes leads off. Patient has also tried to leave the bed three times. Patient says "she is going home to take care of kids" while looking for a dog in the room. Primary RN has been notified and give prn ativan.  High fall risk protocol has been initiated at admission. Bed alarms are on and floor mat are set. Patient educated on the importance of keeping the BIPAP on.

## 2021-03-04 NOTE — Consult Note (Signed)
NAME:  Cassandra Riley, MRN:  [id number], DOB:  09-09-1956, LOS: 0 ADMISSION DATE:  02/26/2021, CONSULTATION DATE: 3/17 REFERRING MD:  Dr. Naida Sleight, CHIEF COMPLAINT:  AMS   History of Present Illness:  Cassandra Riley is 65 y.o. female, with a pertinent medical history of COPD, PAH, CHF, and cor pulmonale, who is benzodiazapine dependent. She presented to [facility] with respiratory distress on 3/15, and was transferred to [facility] on 3/16 with concerns of a STEMI. [facility] cardiology felt that this was not an NSTEMI based on diagnostics and she was admitted with concern for COPD exacerbation / CHF exacerbation.  PCCM was consulted on 3/17 by [facility] due to agitation, AMS, BIPAP requirements. Per nursing, Cassandra Riley had been attempting to get out bed, pulling her bipap mask off. She had been initiated on a CIWA protocol due for benzodiazapine use (72m BID baseline ativan). At time of examination she had received a total of 8.5 mg of ativan and a 1 mg of haldol since 9 am. CXR review showed hyperinflation with bronchiectatic changes, ? RLL scarring vs atelectasis.  [facility] notable for [facility]  Labs notable for BNP >4,500 and troponin leak ~ 720 thought to be demand ischemia. Patient was given 424mof IV lasix with greater than 2 L response.  ABG consistent with acute on chronic hypercarbic respiratory failure.  Pertinent  Medical History  COPD (3L home O2) - never been on mechanical ventilation Tobacco Abuse - 10 year, smoked up to 3ppd, average 2ppd CHF HTN Cor Pulmonale Chronic Benzodiazapine Use (64m57mID)  Significant Hospital Events: Including procedures, antibiotic start and stop dates in addition to other pertinent events   . 3/16 Admit from [facility] with AMS, hypercarbic resp failure . 3/17 Bi-PAP, intermittent agitation, tx to ICU  Interim History / Subjective:  Unable to obtain due to mental status Afebrile  Intermittent agitation mixed with somnolence    Objective   Blood pressure 99/65, pulse 95, temperature 97.6 F (36.4 C), temperature source Axillary, resp. rate 16, height _0  (1.575 m), weight 50.8 kg, SpO2 100 %.    FiO2 (%):  [60 %-100 %] 60 %   Intake/Output Summary (Last 24 hours) at 03/04/2021 1334 Last data filed at 03/04/2021 0944 Gross per 24 hour  Intake 0 ml  Output 2150 ml  Net -2150 ml   Filed Weights   03/10/2021 1947 03/04/21 0308  Weight: 50.5 kg 50.8 kg    Examination: General: somnolent, chronically ill appearing HENT: Trachea midline, mucous membranes moist, bipap mask in place Lungs: non-labored on BiPAP, symmetrical expansion, lungs bilaterally with good air movement, no wheezing Cardiovascular: s1s2, NSR, no mumurs, rubs, or gallops appreciated Abdomen: soft, active bowel sounds, unable to assess tenderness Extremities: Capillary refill <3 seconds, skin warm/dry Neuro: PERRL 2mm53mCS 4, unable to hold arm up against gravity  GU: Foley in place  Resolved Hospital Problem list     Assessment & Plan:   Acute on Chronic Respiratory Failure with Hypercarbia -Continue BiPAP 30% IPAP 20, EPAP 8 -D/C CIWA protocol -Goal Spo2 between 88 and 92% -Collect ABG this afternoon, will adjust Bipap as needed -AM Chest xray, will monitor -Aspiration precautions -Attempting to avoid intubation due to COPD history and anticipated difficulty weaning from ventilator  Acute Metabolic Encephalolpy  Multifactorial encephalopathy in the setting of hypercarbia, sedation  -Continue BiPAP -Avoid further sedating drugs -Delirium prevention measures -Decrease dose of methylprednisolone to 20 mg QD  -Continue Thiamine  COPD with Acute Exacerbation  -Continue Brovana, Pulmicort, and Solu-Medrol -Goal Spo2 between 88 and 92% -Pulmonary hygiene as able  Benzodiapizine Dependence -D/C CIWA protocol -Will reassess need for benzodiazepines   AKI  Baseline cr 0.5 on 3/4 at [facility], rise on admit with AKI  -LR 500 ml  bolus now  -Trend BMP / urinary output -Replace electrolytes as indicated -Avoid nephrotoxic agents, ensure adequate renal perfusion  Demand Ischemia  Hx HTN, CHF / Cor Pulmonale, HLD   -evaluated by Cardiology, felt to be demand ischemia -tele monitoring    Best practice (evaluated daily)  Diet:  NPO Pain/Anxiety/Delirium protocol (if indicated): Yes (RASS goal -1) VAP protocol (if indicated): Yes DVT prophylaxis: Subcutaneous Heparin GI prophylaxis: PPI Glucose control:  None Central venous access:  Yes, and it is still needed Arterial line:  N/A Foley:  Yes, and it is still needed Mobility:  bed rest  PT consulted: N/A Last date of multidisciplinary goals of care discussion 3/17 - reviewed change of clincial status with daughter via phone. She indicates the patient was previously a DNR but is hopeful this will be a reversible process. We briefly discussed the concept of support with vent / IV but no trach or prolonged support.   Code Status:  full code Disposition: ICU  Labs   CBC: Recent Labs  Lab 03/12/2021 2043  WBC 10.1  HGB 11.9*  HCT 40.7  MCV 90.6  PLT 973    Basic Metabolic Panel: Recent Labs  Lab 03/06/2021 2043 03/02/2021 2202 03/04/21 0029  NA 143  --  143  K 3.7  --  4.2  CL 98  --  96*  CO2 36*  --  37*  GLUCOSE 101*  --  145*  BUN 40*  --  41*  CREATININE 1.25*  --  1.23*  CALCIUM 8.6*  --  8.7*  MG  --  2.1  --   PHOS  --  3.9  --    GFR: Estimated Creatinine Clearance: 36.5 mL/min (A) (by C-G formula based on SCr of 1.23 mg/dL (H)). Recent Labs  Lab 03/12/2021 2043  WBC 10.1    Liver Function Tests: Recent Labs  Lab 02/21/2021 2043  AST 31  ALT 37  ALKPHOS 202*  BILITOT 1.1  PROT 6.1*  ALBUMIN 3.4*   No results for input(s): LIPASE, AMYLASE in the last 168 hours. No results for input(s): AMMONIA in the last 168 hours.  ABG    Component Value Date/Time   PHART 7.318 (L) 03/04/2021 0611   PCO2ART 83.4 (HH) 03/04/2021 0611    PO2ART 180 (H) 03/04/2021 0611   HCO3 41.9 (H) 03/04/2021 0611   O2SAT 99.0 03/04/2021 0611     Coagulation Profile: No results for input(s): INR, PROTIME in the last 168 hours.  Cardiac Enzymes: No results for input(s): CKTOTAL, CKMB, CKMBINDEX, TROPONINI in the last 168 hours.  HbA1C: No results found for: HGBA1C  CBG: No results for input(s): GLUCAP in the last 168 hours.  Review of Systems:   Unable to ubtain to GCS of 4, information obtained from staff at bedside, chart reviewed.  Past Medical History:  She,  has a past medical history of CHF (congestive heart failure) ([place]), Chronic respiratory failure with hypoxia ([place]), and COPD (chronic obstructive pulmonary disease) ([place]).   Surgical History:  History reviewed. No pertinent surgical history.   Social History:   reports that she has been smoking cigarettes. She does not have any smokeless tobacco history on file. She  reports current drug use. Drug: Benzodiazepines. She reports that she does not drink alcohol.   Family History:  Her family history includes Lung cancer in her sister.   Allergies Allergies  Allergen Reactions  . Morphine And Related Nausea And Vomiting     Home Medications  Prior to Admission medications   Medication Sig Start Date End Date Taking? Authorizing Provider  albuterol (VENTOLIN HFA) 108 (90 Base) MCG/ACT inhaler Inhale 2 puffs into the lungs every 4 (four) hours as needed for shortness of breath or wheezing.    [provider]  alendronate (FOSAMAX) 70 MG tablet Take 70 mg by mouth once a week. 02/08/21   [provider]  ANORO ELLIPTA 62.5-25 MCG/INH AEPB Inhale 1 puff into the lungs daily. 02/27/21   [provider]  atorvastatin (LIPITOR) 80 MG tablet Take 80 mg by mouth daily. 02/27/21   [provider]  fexofenadine (ALLEGRA) 180 MG tablet Take 180 mg by mouth daily. 02/08/21   [provider]  furosemide (LASIX) 20 MG tablet Take 20 mg by  mouth daily. 02/22/21   [provider]  hydrochlorothiazide (HYDRODIURIL) 25 MG tablet Take 25 mg by mouth daily. 02/27/21   [provider]  ipratropium-albuterol (DUONEB) 0.5-2.5 (3) MG/3ML SOLN Take 3 mLs by nebulization every 4 (four) hours. 02/08/21   [provider]  LORazepam (ATIVAN) 1 MG tablet Take 1 mg by mouth 2 (two) times daily. 02/27/21   [provider]  methocarbamol (ROBAXIN) 500 MG tablet Take 500 mg by mouth 3 (three) times daily. 02/27/21   [provider]  mirtazapine (REMERON) 15 MG tablet Take 15 mg by mouth at bedtime. 02/22/21   [provider]     Critical care time: 32 minutes     Redmond School., MSN, APRN, AGACNP-BC [facility]  03/04/2021 , 3:28 PM   Please see [email or web address] for pager details  From 7a-7p if no response, please call [phone] After hours, please call Elink at [phone]

## 2021-03-04 NOTE — Progress Notes (Signed)
PROGRESS NOTE    Cassandra Riley  CWC:[id number] DOB: July 16, 1956 DOA: 03/02/2021 PCP: Neale Burly, MD    Brief Narrative:  Cassandra Riley is a 65 y.o. 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 Pt presented 3/15 to [facility] with respiratory distress.  Reportedly refused BIPAP but improved significantly with nebs and solumedrol.  Had second episode in ED that responded to nebs .Pt also found to have CHF decompensation with ProBNP of 54k, no JVD, no extremity edema, did have pulm edema on CXR.  Given 46m IV lasix [facility] with T wave findings.  [facility] well [facility] our cards fellow) were less impressed for primary cardiac event.  Felt that EKGs and trop elevations were c/w respiratory disease.  Neither cards fellow recd a heparin gtt.[facility] of cath lab if needed.No respiratory distress at this time, is on her baseline 3L.  3/17-pt confused, on bipap, keeps taking it off . Was given haldol and ativan without much help. Have asked ICU to evaluate for transfer to ICU  Consultants:   cardiology  Procedures:   Antimicrobials:       Subjective: This am sleepying with bipap on.   Objective: Vitals:   03/04/21 0012 03/04/21 0217 03/04/21 0308 03/04/21 0722  BP:   (!) 108/98 97/66  Pulse: (!) 127 (!) 116 (!) 112 97  Resp: 18 18 (!) 24 18  Temp:   (!) 97 F (36.1 C) (!) 97.3 F (36.3 C)  TempSrc:   Axillary Axillary  SpO2: 97%  100% 96%  Weight:   50.8 kg   Height:        Intake/Output Summary (Last 24 hours) at 03/04/2021 0817 Last data filed at 03/04/2021 0600 Gross per 24 hour  Intake -  Output 1900 ml  Net -1900 ml   Filed Weights   03/01/2021 1947 03/04/21 0308  Weight: 50.5 kg 50.8 kg     Examination:  General exam: Appears calm and comfortable , sleepy, on bipap Respiratory system:bipap sound overtaking respiratory status Cardiovascular system: S1 & S2 heard, RRR. No JVD, murmurs, rubs, gallops or clicks.  Gastrointestinal system: Abdomen is nondistended, soft and nontender Normal bowel sounds heard. Central nervous system:unable to assess Extremities: +edema Skin:w arm, dry Psychiatry:unable to asses    Data Reviewed: I have personally reviewed following labs and imaging studies  CBC: Recent Labs  Lab 02/26/2021 2043  WBC 10.1  HGB 11.9*  HCT 40.7  MCV 90.6  PLT 2761  Basic Metabolic Panel: Recent Labs  Lab 02/26/2021 2043 03/12/2021 2202 03/04/21 0029  NA 143  --  143  K 3.7  --  4.2  CL 98  --  96*  CO2 36*  --  37*  GLUCOSE 101*  --  145*  BUN 40*  --  41*  CREATININE 1.25*  --  1.23*  CALCIUM 8.6*  --  8.7*  MG  --  2.1  --   PHOS  --  3.9  --    GFR: Estimated Creatinine Clearance: 36.5 mL/min (A) (by C-G formula based on SCr of 1.23 mg/dL (H)). Liver Function Tests: Recent Labs  Lab 03/15/2021 2043  AST 31  ALT 37  ALKPHOS 202*  BILITOT 1.1  PROT 6.1*  ALBUMIN 3.4*  No results for input(s): LIPASE, AMYLASE in the last 168 hours. No results for input(s): AMMONIA in the last 168 hours. Coagulation Profile: No results for input(s): INR, PROTIME in the last 168 hours. Cardiac Enzymes: No results for input(s): CKTOTAL, CKMB, CKMBINDEX, TROPONINI in the last 168 hours. BNP (last 3 results) No results for input(s): PROBNP in the last 8760 hours. HbA1C: No results for input(s): HGBA1C in the last 72 hours. CBG: No results for input(s): GLUCAP in the last 168 hours. Lipid Profile: No results for input(s): CHOL, HDL, LDLCALC, TRIG, CHOLHDL, LDLDIRECT in the last 72 hours. Thyroid Function Tests: No results for input(s): TSH, T4TOTAL, FREET4, T3FREE, THYROIDAB in the last 72 hours. Anemia Panel: No results for input(s): VITAMINB12,  FOLATE, FERRITIN, TIBC, IRON, RETICCTPCT in the last 72 hours. Sepsis Labs: No results for input(s): PROCALCITON, LATICACIDVEN in the last 168 hours.  No results found for this or any previous visit (from the past 240 hour(s)).       Radiology Studies: DG CHEST PORT 1 VIEW  Result Date: 03/04/2021 CLINICAL DATA:  Hypoxia short of breath EXAM: PORTABLE CHEST 1 VIEW COMPARISON:  03/02/2021 FINDINGS: Cardiomegaly with vascular congestion. No consolidation or pleural effusion. No pneumothorax. Chronic deformity at the right humeral head. IMPRESSION: Cardiomegaly with vascular congestion. Electronically Signed   By: Donavan Foil M.D.   On: 03/04/2021 00:17        Scheduled Meds: . albuterol  2.5 mg Nebulization Q6H  . enoxaparin (LOVENOX) injection  40 mg Subcutaneous Q24H  . folic acid  1 mg Oral Daily  . furosemide  40 mg Intravenous Daily  . mometasone-formoterol  2 puff Inhalation BID  . multivitamin with minerals  1 tablet Oral Daily  . nicotine  21 mg Transdermal Daily  . oxybutynin  5 mg Oral TID  . predniSONE  40 mg Oral Q breakfast  . thiamine  100 mg Oral Daily   Or  . thiamine  100 mg Intravenous Daily  . umeclidinium bromide  1 puff Inhalation Daily   Continuous Infusions:  Assessment & Plan:   Principal Problem:   Acute on chronic diastolic CHF (congestive heart failure) (HCC) Active Problems:   Chronic respiratory failure with hypoxia (HCC)   COPD with acute exacerbation (HCC)   Benzodiazepine dependence (HCC)  1.a/cDHF- with RHF bnp >45000 Volume overloaded Continue lasix iv bid 26m  Cardiology consulted I/o Daily weight  2.acute COPD exacerbation- On bipap Will obtain another ABG since been on bipap , although she tries taking it off PCCM consulted for evaluation, may need to be transferred to ICU for higher level of care ICU team consulted.   3. Acute on Chronic respiratory failure with hypoxia and hypercapnea On bipap  pccm  consulted Continue iv lasix  4.BZD dependence/abuse Continue on CIWA protocal for withdrawal prevention  .    DVT prophylaxis: lovenox Code Status:full Family Communication: none at bedside :  Status is: Observation  The patient will require care spanning > 2 midnights and should be moved to inpatient because: Inpatient level of care appropriate due to severity of illness  Dispo: The patient is from: Home              Anticipated d/c is to: TBD              Patient currently is not medically stable to d/c.   Difficult to place patient No            LOS: 0 days  Time spent: 35 minutes with 50% on COC    Nolberto Hanlon, MD [facility] Pager [phone]  If 7PM-7AM, please contact night-coverage 03/04/2021, 8:17 AM

## 2021-03-04 NOTE — Progress Notes (Signed)
Patient still very agitated, combative 1 mg ativan given will continue to monitor

## 2021-03-04 NOTE — Progress Notes (Signed)
Agitated, pulling off BIPAP, Bps low.  Tearing skin.  Need to intubate for patient safety to get better idea what is going on with her hemodynamics, and heart.  Erskine Emery MD PCCM

## 2021-03-04 NOTE — Progress Notes (Signed)
EEG complete - results pending.

## 2021-03-04 NOTE — Progress Notes (Signed)
Patient has arrived from [facility] with foley. Patient foley added into the LDA and Amery MD paged to notify for order to continue use of foley. Primary RN made aware.

## 2021-03-04 NOTE — Progress Notes (Signed)
Heard primary RN yelling from patient's room for help. Ativan 27m was recently given, but patient was getting out of bed and had a hold to the nurse and would not let go. MD was paged. Ativan 417mwas given an hour later as the patient would not calm down and was still fighting. Rapid response was called to notify.

## 2021-03-04 NOTE — Consult Note (Addendum)
Cardiology Consultation:   Patient ID: Cassandra Riley MRN: [id number]; DOB: [birth year]  Admit date: 03/11/2021 Date of Consult: 03/04/2021  PCP:  Neale Burly, MD   [place]  Cardiologist:   new - Dr.Tyrie Porzio  Patient Profile:   Cassandra Riley is a 65 y.o. female with a hx of COPD, pulmonary hypertension, cor pulmonale, chronic respiratory failure on 3L O2 continuous, benzodiazepine dependent, and chronic diastolic heart failure who is being seen today for the evaluation of elevated troponin and CHF at the request of Dr. Kurtis Bushman.  History of Present Illness:   Cassandra Riley does not currently follow with cardiology. She has an extensive history of COPD with chronic respiratory failure on home O2 and pulmonary hypertension. She was able to quit smoking in 2017, but restarted 3 months ago. She has also been abusing benzodiazepines by taking leftover medications after relatives have died.   She presented to [facility] 01/20/21 with COPD exacerbation. She was placed on BiPAP overnight with improvement, ABG with CO2 retention. Pt was still an active smoker. CXR with concern for malignancy. Pt does have O2 at home. Echo 01/21/21 showed normal EF, grade 1 DD, severely dilated RV with moderately reduced RV systolic function, mild pulmonary hypertension. Benzodiazepine use suspected secondary to end stage COPD and anxiety with dyspnea.  She presented to [facility] 02/14/21 with COPD exacerbation. She was treated with BiPAP and lasix with improvement.    She presented to [facility] on 03/02/21 with respiratory distress. She reportedly refused BiPAP but improved with nebs and solumedrol. She had second "episode" in the ER that responded to nebs. Her Pro-BNP was elevated to 54000, but no signs of volume overload on exam.   IM note states that EDP was concerned about TWI and suspected OOH STEMI. [facility] cardiology fellow and [facility] cardiology fellow were both consulted for possible late-presenting  STEMI - both fellows agreed that her EKGs and troponin elevation was more consistent with respiratory distress rather than ACS and recommended against heparin gtt.   She has been removing her O2 throughout her ER and [facility] stay. Last evening, she desatted after taking off her O2. O2 replaced and titrated up. Pt continued to remove oxygen and became increasingly confused overnight.  Cardiology was consulted for elevated troponin and suspected heart failure.   BNP > 4500 EKG with sinus tachycardia with RBBB HS troponin 720 --> 679 AKI sCr 1.28, albumin 3.4 Alk phos 202  She has received 40 mg IV lasix x 1 does with nearly 2L urine output.   On arrival to the patient's room, she is sedated with BiPAP in place, hypotensive, and sedated.    Past Medical History:  Diagnosis Date  . CHF (congestive heart failure) ([place])   . Chronic respiratory failure with hypoxia (HCC)    3L  . COPD (chronic obstructive pulmonary disease) ([place])     History reviewed. No pertinent surgical history.   Home Medications:  Prior to Admission medications   Medication Sig Start Date End Date Taking? Authorizing Provider  albuterol (VENTOLIN HFA) 108 (90 Base) MCG/ACT inhaler Inhale 2 puffs into the lungs every 4 (four) hours as needed for shortness of breath or wheezing.    [provider]  alendronate (FOSAMAX) 70 MG tablet Take 70 mg by mouth once a week. 02/08/21   [provider]  ANORO ELLIPTA 62.5-25 MCG/INH AEPB Inhale 1 puff into the lungs daily. 02/27/21   [provider]  atorvastatin (LIPITOR) 80 MG tablet Take 80 mg  by mouth daily. 02/27/21   [provider]  fexofenadine (ALLEGRA) 180 MG tablet Take 180 mg by mouth daily. 02/08/21   [provider]  furosemide (LASIX) 20 MG tablet Take 20 mg by mouth daily. 02/22/21   [provider]  hydrochlorothiazide (HYDRODIURIL) 25 MG tablet Take 25 mg by mouth daily. 02/27/21   [provider]   ipratropium-albuterol (DUONEB) 0.5-2.5 (3) MG/3ML SOLN Take 3 mLs by nebulization every 4 (four) hours. 02/08/21   [provider]  LORazepam (ATIVAN) 1 MG tablet Take 1 mg by mouth 2 (two) times daily. 02/27/21   [provider]  methocarbamol (ROBAXIN) 500 MG tablet Take 500 mg by mouth 3 (three) times daily. 02/27/21   [provider]  mirtazapine (REMERON) 15 MG tablet Take 15 mg by mouth at bedtime. 02/22/21   [provider]    Inpatient Medications: Scheduled Meds: . albuterol  2.5 mg Nebulization Q6H  . Chlorhexidine Gluconate Cloth  6 each Topical Daily  . enoxaparin (LOVENOX) injection  40 mg Subcutaneous Q24H  . folic acid  1 mg Oral Daily  . furosemide  40 mg Intravenous BID  . haloperidol lactate      . midodrine  5 mg Oral TID WC  . mometasone-formoterol  2 puff Inhalation BID  . multivitamin with minerals  1 tablet Oral Daily  . nicotine  21 mg Transdermal Daily  . oxybutynin  5 mg Oral TID  . pantoprazole  20 mg Oral Daily  . predniSONE  40 mg Oral Q breakfast  . thiamine  100 mg Oral Daily   Or  . thiamine  100 mg Intravenous Daily  . umeclidinium bromide  1 puff Inhalation Daily   Continuous Infusions: . lactated ringers     PRN Meds: levalbuterol, LORazepam **OR** LORazepam  Allergies:    Allergies  Allergen Reactions  . Morphine And Related Nausea And Vomiting    Social History:   Social History   Socioeconomic History  . Marital status: Single    Spouse name: Not on file  . Number of children: Not on file  . Years of education: Not on file  . Highest education level: Not on file  Occupational History  . Not on file  Tobacco Use  . Smoking status: Current Every Day Smoker    Types: Cigarettes  . Smokeless tobacco: Not on file  Substance and Sexual Activity  . Alcohol use: Never  . Drug use: Yes    Types: Benzodiazepines  . Sexual activity: Not on file  Other Topics Concern  . Not on file  Social History  Narrative  . Not on file   Social Determinants of Health   Financial Resource Strain: Not on file  Food Insecurity: Not on file  Transportation Needs: Not on file  Physical Activity: Not on file  Stress: Not on file  Social Connections: Not on file  Intimate Partner Violence: Not on file    Family History:    Family History  Problem Relation Age of Onset  . Lung cancer Sister      ROS:  Please see the history of present illness.   All other ROS reviewed and negative.     Physical Exam/Data:   Vitals:   03/04/21 1120 03/04/21 1203 03/04/21 1317 03/04/21 1411  BP:  111/76 99/65   Pulse: (!) 108 95    Resp: 20 16    Temp:      TempSrc:      SpO2:  100%  Weight:      Height:        Intake/Output Summary (Last 24 hours) at 03/04/2021 1421 Last data filed at 03/04/2021 1300 Gross per 24 hour  Intake 0 ml  Output 2300 ml  Net -2300 ml   Last 3 Weights 03/04/2021 03/07/2021  Weight (lbs) 111 lb 15.9 oz 111 lb 5.3 oz  Weight (kg) 50.8 kg 50.5 kg     Body mass index is 20.48 kg/m.  General:  Thin elderly female, sedated with BiPAP in place Neck: no JVD Cardiac:  normal S1, S2; RRR; no murmur  Lungs: BiPAP in place Abd: soft, nontender, no hepatomegaly  Ext: no edema, warm Musculoskeletal:  No deformities, BUE and BLE strength normal and equal Skin: warm and dry  Neuro:  CNs 2-12 intact, no focal abnormalities noted Psych:  Normal affect   EKG:  The EKG was personally reviewed and demonstrates:  Sinus tachycardia with HR 110 RBBB, possible prior anterolateral inarct Telemetry:  Telemetry was personally reviewed and demonstrates:  Sinus rhythm to sinus tachycardia 80-100s  Relevant CV Studies:  Echo pending   Echo 01/21/21: Summary  1. The left ventricle is normal in size with normal wall thickness.  2. The left ventricular systolic function is normal, LVEF is visually  estimated at 65-70%.  3. There is grade I diastolic dysfunction (impaired  relaxation).  4. The left atrium is mildly dilated in size.  5. The right ventricle is severely dilated in size, with moderately reduced  systolic function.  6. There is moderate tricuspid regurgitation.  7. There is mild pulmonary hypertension, estimated pulmonary artery systolic  pressure is 40 mmHg.  8. There is moderate pulmonic regurgitation.  9. The right atrium is moderately dilated in size.     Laboratory Data:  High Sensitivity Troponin:   Recent Labs  Lab 03/02/2021 2043 03/02/2021 2202  TROPONINIHS 720* 679*     Chemistry Recent Labs  Lab 02/16/2021 2043 03/04/21 0029  NA 143 143  K 3.7 4.2  CL 98 96*  CO2 36* 37*  GLUCOSE 101* 145*  BUN 40* 41*  CREATININE 1.25* 1.23*  CALCIUM 8.6* 8.7*  GFRNONAA 48* 49*  ANIONGAP 9 10    Recent Labs  Lab 02/19/2021 2043  PROT 6.1*  ALBUMIN 3.4*  AST 31  ALT 37  ALKPHOS 202*  BILITOT 1.1   Hematology Recent Labs  Lab 02/20/2021 2043  WBC 10.1  RBC 4.49  HGB 11.9*  HCT 40.7  MCV 90.6  MCH 26.5  MCHC 29.2*  RDW 22.5*  PLT 247   BNP Recent Labs  Lab 02/16/2021 2043  BNP >4,500.0*    DDimer No results for input(s): DDIMER in the last 168 hours.   Radiology/Studies:  DG CHEST PORT 1 VIEW  Result Date: 03/04/2021 CLINICAL DATA:  Hypoxia short of breath EXAM: PORTABLE CHEST 1 VIEW COMPARISON:  03/02/2021 FINDINGS: Cardiomegaly with vascular congestion. No consolidation or pleural effusion. No pneumothorax. Chronic deformity at the right humeral head. IMPRESSION: Cardiomegaly with vascular congestion. Electronically Signed   By: Donavan Foil M.D.   On: 03/04/2021 00:17     Assessment and Plan:   COPD exacerbation Acute on chronic respiratory failure Pulmonary hypertension Dilated RV - currently on BiPAP - appreciate PCCM input  Chronic diastolic heart failure - she has received 40 mg IV lasix with good diuresis - she does not appear volume up, may be a bit dry  Elevated troponin -  consistently in the mid-900s -  no reports of chest pain - suspect this is demand ischemia given her tenuous respiratory status  Hypotension - she is significantly hypotensive SBP 79 - transferring to higher level of care - may need pressor support  Difficult situation: Pt does have pulmonary hypertension with dilated RV. She had a recent echo with preserved EF and mild DD. BNP > 4500. She also has moderate TR. However, I think her respiratory failure is driving her symptoms. Per RN, she has not complained of chest pain. She is a poor candidate for invasive interventions. She is currently full code. Recommend goals of care discussion with family.   Risk Assessment/Risk Scores:     TIMI Risk Score for Unstable Angina or Non-ST Elevation MI:   The patient's TIMI risk score is 1, which indicates a 5% risk of all cause mortality, new or recurrent myocardial infarction or need for urgent revascularization in the next 14 days.   For questions or updates, please contact [place] Please consult [email or web address] for contact info under   Signed, Ledora Bottcher, [place]  03/04/2021 2:21 PM  The patient was seen, examined and discussed with Minette Brine , PA-C and I agree with the above.   65 year old female with h/o end stage COPD with chronic respiratory failure with hypercapnia on home O2 and pulmonary hypertension. She was able to quit smoking in 2017, but restarted 3 months ago. She has also been abusing benzodiazepines by taking leftover medications after relatives have died.   She presented to [facility] 01/20/21 with COPD exacerbation. She was placed on BiPAP overnight with improvement, ABG with CO2 retention. Pt was still an active smoker. CXR with concern for malignancy.   Echo 01/21/21 showed normal EF, grade 1 DD, severely dilated RV with moderately reduced RV systolic function, mild pulmonary hypertension. Benzodiazepine use suspected secondary to end stage COPD and anxiety with dyspnea.  On  physical exam the patient is on BiPAP and not arousable, her O2 sats prior to initiation of BiPAP were 78% currently 95%, she appears cyanotic, with warm extremities, no crackles on her lungs, regular cardiac beats with no obvious murmur.  Her blood pressure is 78/52, and she is being actively transferred to the ICU. Her blood gases show pH of 7.2, PCO2 of 101, O2 sats 96%. HS troponin 720 --> 679, EKG with sinus tachycardia with RBBB   Assessment and Plan:   Acute on chronic hypercapnic respiratory failure RA strain with dilatation and dysfunction and pulmonary hypertension Minimal troponin elevation in the settings of hypoxia representing demand ischemia The patient does not have symptoms of chest pain and in the setting of end-stage COPD and possible lung malignancy she is not a candidate for any invasive interventions. Does not appear to be fluid overloaded on physical exam but rather dry, BP centimeters is managing underlying disease, will watch from the distance but currently does not need any cardiac work-up or diuresis.  I would hold IV diuretics for now.  Ena Dawley, MD 03/04/2021

## 2021-03-04 NOTE — Progress Notes (Signed)
Initial Nutrition Assessment  DOCUMENTATION CODES:   Not applicable  INTERVENTION:   -Ensure Enlive TID, each supplement provides 350 kcal, 20 grams protein   -Magic Cup BID, each supplement provides 290 kcal, 9 grams protein   -Continue MVI with minerals po daily  NUTRITION DIAGNOSIS:   Increased nutrient needs related to chronic illness (CHF, COPD) as evidenced by estimated needs.  GOAL:   Patient will meet greater than or equal to 90% of their needs  MONITOR:   PO intake,Supplement acceptance,Skin,I & O's,Labs,Weight trends  REASON FOR ASSESSMENT:   Consult Assessment of nutrition requirement/status  ASSESSMENT:   60 YOF admitted for CHF. PMH of COPD, CHF, chronic resp failure with hypoxia on 3L O2, benzodiazepine dependence on CIWA.  Per MD notes, pt presented with respiratory distress to [facility]. Pt refused BiPAP, but improved with nebs and solumedrol. IV diuretics being considered for CHF. Pt transferred to [facility] for further care and cath lab if needed. Rapid response for acute hypoxic respiratory failure (pt pulled off O2) on 3/17 and then called again due to pt getting out of bed and not letting go of nurse.  Per chart meal documentation, pt has been consuming 0% of meals. Intern unable to see pt during time of visit due to rapid response called and RN asking intern not to. RN reports that pt has been aggressive and noncompliant due to confused state. RN reports that pt is currently on bipap which prevents her from communicating easily. Due to inability to see pt, intern was unable to obtain nutrition history or complete NFPE. With pt's chronic illnesses and acute state, pt could benefit from oral nutrition supplements.    Pt's weights reviewed and show no weight history prior to admission.   Meds reviewed: Folvite (daily), Lasix (BID), MVI with minerals (daily), Deltasone (daily), Thiamine (daily) Labs reviewed: Corrected Calcium (9.18)  I&O's reviewed: -2,150 L x  admit  UOP: 1,900 mL x 24 hrs  Diet Order:   Diet Order            Diet Heart Room service appropriate? Yes; Fluid consistency: Thin  Diet effective now                 EDUCATION NEEDS:   No education needs have been identified at this time  Skin:  Skin Assessment: Reviewed RN Assessment  Last BM:  03/15/2021  Height:   Ht Readings from Last 1 Encounters:  02/20/2021 5' 2" (1.575 m)    Weight:   Wt Readings from Last 1 Encounters:  03/04/21 50.8 kg    Ideal Body Weight:  52.3 kg  BMI:  Body mass index is 20.48 kg/m.  Estimated Nutritional Needs:   Kcal:  1600-1800  Protein:  80-95 g  Fluid:  >/= 1.6 L    Salvadore Oxford, Dietetic Intern 03/04/2021 2:03 PM

## 2021-03-04 NOTE — Progress Notes (Signed)
COVID-19 negative test result located in Care Everywhere.

## 2021-03-04 NOTE — Significant Event (Addendum)
Rapid Response Event Note   Reason for Call : Acute Hypoxic respiratory failure (pt pulled O2 off) Initial Focused Assessment:  Nursing notified me of pt with cyanosis and sats in the 70s following pulling off her NRB mask. Pt was trialed earlier with BIPAP reportedly but did not tolerate. Pt is alert, oriented x3 and making poor decisions by pulling off medical devices. I discussed BIPAP with pt and she is agreeable to trying BIPAP again. Pt has diffuse rales throughout with some exp wheezes, prominent JVD with moderate increased WOB and accessory muscle use. Skin is warm, dusky and dry. Pt at high risk of intubation if she does not continue to cooperate and pull devices off. Stat PCXR also ordered and performed.   0015-Afebrile, HR 127 ST, 121/82 (91), RR 20 sats 98% on 15L NRB.  PCXR-  IMPRESSION: Cardiomegaly with vascular congestion.  Interventions:  -BIPAP, PCXR previously ordered - ABG in 30-1 hr of being on BIPAP -Additional Lasix 58m ordered per provider.    MD Notified:  Per nursing staff Call Time: 2352 Arrival Time: 2355 End Time: 0020  RMadelynn Done RN

## 2021-03-04 NOTE — Progress Notes (Signed)
Patient attempted to leave the bed twice. Re-oriented and re-educated patient on her safety, bed alarms still in place, floor mats in place, and the importance of keeping the BIPAP mask on.

## 2021-03-04 NOTE — Progress Notes (Signed)
OT Cancellation Note  Patient Details Name: Cassandra Riley MRN: [id number] DOB: 19-Apr-1956   Cancelled Treatment:    Reason Eval/Treat Not Completed: Medical issues which prohibited therapy.  Patient placed on BiPaP, OT to continue efforts as appropriate.    Richard D Popella 03/04/2021, 10:00 AM

## 2021-03-04 NOTE — Progress Notes (Signed)
Patient was tx from 3E02 to 3M03 on V60 BIPAP. No complications. Report given to Unit RT

## 2021-03-04 NOTE — Progress Notes (Signed)
Patient very confuse , agitated pilling off tubings, Bipap. MD notified

## 2021-03-04 NOTE — Progress Notes (Signed)
Patient remain in SR in 80 s , 98% /BIPAP BP soft in 80/55 RR called and came to see the patient. Patient transfer to 3M03 per order.

## 2021-03-04 NOTE — Procedures (Addendum)
Patient Name: Cassandra Riley  MRN: [id number]  Epilepsy Attending: Lora Havens  Referring Physician/Provider: Noe Gens, NP Date: 03/04/2021 Duration: 26.15 mins  Patient history: 65yo F with ams. EEG to evaluate for seizure  Level of alertness: comatose  AEDs during EEG study: None  Technical aspects: This EEG study was done with scalp electrodes positioned according to the 10-20 International system of electrode placement. Electrical activity was acquired at a sampling rate of 500Hz and reviewed with a high frequency filter of 70Hz and a low frequency filter of 1Hz. EEG data were recorded continuously and digitally stored.   Description: EEG showed continuous/intermittent generalized polymorphic mixed frequencies with predominantly 3 to 6 Hz theta-delta slowing admixed with 16-18hz beta activity. Hyperventilation and photic stimulation were not performed.     ABNORMALITY - Continuous slow, generalized  IMPRESSION: This study is suggestive of moderate to severe diffuse encephalopathy, nonspecific etiology but could be related to sedation. No seizures or epileptiform discharges were seen throughout the recording.  Cadee Agro Barbra Sarks

## 2021-03-04 NOTE — Procedures (Signed)
Intubation Procedure Note  Cassandra Riley  [id number]  1956/04/17  Date:03/04/21  Time:3:47 PM   Provider Performing:Yafet Cline Chauncey Cruel Iona Beard    Procedure: Intubation (31500)  Indication(s) Respiratory Failure  Consent Risks of the procedure as well as the alternatives and risks of each were explained to the patient and/or caregiver.  Consent for the procedure was obtained and is signed in the bedside chart   Anesthesia Etomidate, Versed, Fentanyl and Rocuronium   Time Out Verified patient identification, verified procedure, site/side was marked, verified correct patient position, special equipment/implants available, medications/allergies/relevant history reviewed, required imaging and test results available.   Sterile Technique Usual hand hygeine, masks, and gloves were used   Procedure Description Patient positioned in bed supine.  Sedation given as noted above.  Patient was intubated with endotracheal tube using glidescope and 8.0 ETT.  View was Grade 1 full glottis .  Number of attempts was 1.  Colorimetric CO2 detector was consistent with tracheal placement.   Complications/Tolerance None; patient tolerated the procedure well. Chest X-ray is ordered to verify placement.   EBL None   Specimen(s) None  Redmond School., MSN, APRN, AGACNP-BC [facility]  03/04/2021 , 3:50 PM   Please see [email or web address] for pager details  From 7a-7p if no response, please call [phone] After hours, please call Elink at [phone]

## 2021-03-04 NOTE — Progress Notes (Signed)
Patient transferred from [facility] about 2000 last night.  Initially on 3L and AAOx4, but desaturated overnight to mid 80s around 2330:  2330:  Saturations found to be 86% on 3L. By RT placed on 6L .  Patient wore for about 15 minutes during breathing tx and removed.  2355: RT and writer in room, patient severely orthopneic and 68% RA with circumoral cyanosis, restless and confused.  0000: rapid repsonse called, MD informed of situation.  VBG, ABG, CXR, and Bipap ordered.  VBG showed PCO2 >100.  Patient placed on bipap and tolerating well initially.  ABG 1hr after bipap placed showed PCO2 down to 90.  Patient up to bedside commode multiple times overnight to attempt to have BM with no result but otherwise tolerating bipap well  0445: patient increasingly confused and pulling of bipap mask.   Redirectable but mentation gradually deteriorating.  7867 on call MD informed.  New order for repeat ABG.  Also gave 0.79m IV ativan per MD (patient already had order for ativan for possible withdrawal but showed no other s/s of withdrawal).  Ativan ineffective and ABG showed only mild improvement to PCO2.  MD made aware.

## 2021-03-05 ENCOUNTER — Inpatient Hospital Stay (HOSPITAL_COMMUNITY): Payer: 59

## 2021-03-05 DIAGNOSIS — J441 Chronic obstructive pulmonary disease with (acute) exacerbation: Secondary | ICD-10-CM | POA: Diagnosis not present

## 2021-03-05 DIAGNOSIS — I5021 Acute systolic (congestive) heart failure: Secondary | ICD-10-CM | POA: Diagnosis not present

## 2021-03-05 DIAGNOSIS — J9622 Acute and chronic respiratory failure with hypercapnia: Secondary | ICD-10-CM

## 2021-03-05 DIAGNOSIS — F1320 Sedative, hypnotic or anxiolytic dependence, uncomplicated: Secondary | ICD-10-CM | POA: Diagnosis not present

## 2021-03-05 DIAGNOSIS — G9341 Metabolic encephalopathy: Secondary | ICD-10-CM | POA: Diagnosis not present

## 2021-03-05 DIAGNOSIS — Z7189 Other specified counseling: Secondary | ICD-10-CM | POA: Diagnosis not present

## 2021-03-05 DIAGNOSIS — J9621 Acute and chronic respiratory failure with hypoxia: Secondary | ICD-10-CM | POA: Diagnosis not present

## 2021-03-05 DIAGNOSIS — I2609 Other pulmonary embolism with acute cor pulmonale: Secondary | ICD-10-CM | POA: Diagnosis not present

## 2021-03-05 DIAGNOSIS — J9611 Chronic respiratory failure with hypoxia: Secondary | ICD-10-CM | POA: Diagnosis not present

## 2021-03-05 DIAGNOSIS — I5033 Acute on chronic diastolic (congestive) heart failure: Secondary | ICD-10-CM | POA: Diagnosis not present

## 2021-03-05 LAB — CBC
HCT: 36.2 % (ref 36.0–46.0)
Hemoglobin: 11.1 g/dL — ABNORMAL LOW (ref 12.0–15.0)
MCH: 26.6 pg (ref 26.0–34.0)
MCHC: 30.7 g/dL (ref 30.0–36.0)
MCV: 86.8 fL (ref 80.0–100.0)
Platelets: 165 10*3/uL (ref 150–400)
RBC: 4.17 MIL/uL (ref 3.87–5.11)
RDW: 22 % — ABNORMAL HIGH (ref 11.5–15.5)
WBC: 4.8 10*3/uL (ref 4.0–10.5)
nRBC: 0 % (ref 0.0–0.2)

## 2021-03-05 LAB — BASIC METABOLIC PANEL
Anion gap: 7 (ref 5–15)
BUN: 28 mg/dL — ABNORMAL HIGH (ref 8–23)
CO2: 39 mmol/L — ABNORMAL HIGH (ref 22–32)
Calcium: 8.2 mg/dL — ABNORMAL LOW (ref 8.9–10.3)
Chloride: 96 mmol/L — ABNORMAL LOW (ref 98–111)
Creatinine, Ser: 0.82 mg/dL (ref 0.44–1.00)
GFR, Estimated: >60 mL/min (ref >60)
Glucose, Bld: 90 mg/dL (ref 70–99)
Potassium: 4.6 mmol/L (ref 3.5–5.1)
Sodium: 142 mmol/L (ref 135–145)

## 2021-03-05 LAB — GLUCOSE, CAPILLARY
Glucose-Capillary: 123 mg/dL — ABNORMAL HIGH (ref 70–99)
Glucose-Capillary: 133 mg/dL — ABNORMAL HIGH (ref 70–99)
Glucose-Capillary: 162 mg/dL — ABNORMAL HIGH (ref 70–99)
Glucose-Capillary: 176 mg/dL — ABNORMAL HIGH (ref 70–99)
Glucose-Capillary: 208 mg/dL — ABNORMAL HIGH (ref 70–99)
Glucose-Capillary: 91 mg/dL (ref 70–99)

## 2021-03-05 LAB — TRIGLYCERIDES: Triglycerides: 123 mg/dL (ref <150)

## 2021-03-05 MED ORDER — OXYBUTYNIN CHLORIDE 5 MG PO TABS
5.0000 mg | ORAL_TABLET | Freq: Three times a day (TID) | ORAL | Status: DC
  Administered 2021-03-05 – 2021-03-08 (×9): 5 mg
  Filled 2021-03-05 (×9): qty 1

## 2021-03-05 MED ORDER — THIAMINE HCL 100 MG/ML IJ SOLN: 100.0000 mg | Freq: Every day | INTRAMUSCULAR | Status: DC

## 2021-03-05 MED ORDER — METHYLPREDNISOLONE SODIUM SUCC 40 MG IJ SOLR
40.0000 mg | Freq: Two times a day (BID) | INTRAMUSCULAR | Status: DC
  Administered 2021-03-05 – 2021-03-08 (×6): 40 mg via INTRAVENOUS
  Filled 2021-03-05 (×6): qty 1

## 2021-03-05 MED ORDER — THIAMINE HCL 100 MG PO TABS
100.0000 mg | ORAL_TABLET | Freq: Every day | ORAL | Status: DC
  Administered 2021-03-06 – 2021-03-08 (×3): 100 mg
  Filled 2021-03-05 (×3): qty 1

## 2021-03-05 MED ORDER — ADULT MULTIVITAMIN W/MINERALS CH
1.0000 | ORAL_TABLET | Freq: Every day | ORAL | Status: DC
  Administered 2021-03-06 – 2021-03-08 (×3): 1
  Filled 2021-03-05 (×3): qty 1

## 2021-03-05 MED ORDER — VITAL HIGH PROTEIN PO LIQD: 1000.0000 mL | ORAL | Status: DC

## 2021-03-05 MED ORDER — PROSOURCE TF PO LIQD: 45.0000 mL | Freq: Two times a day (BID) | ORAL | Status: DC

## 2021-03-05 MED ORDER — POLYETHYLENE GLYCOL 3350 17 G PO PACK: 17.0000 g | PACK | Freq: Every day | ORAL | Status: DC | PRN

## 2021-03-05 MED ORDER — MIDAZOLAM HCL 2 MG/2ML IJ SOLN
2.0000 mg | INTRAMUSCULAR | Status: DC | PRN
  Administered 2021-03-05 – 2021-03-07 (×6): 2 mg via INTRAVENOUS
  Filled 2021-03-05 (×7): qty 2

## 2021-03-05 MED ORDER — NICOTINE 14 MG/24HR TD PT24
14.0000 mg | MEDICATED_PATCH | Freq: Every day | TRANSDERMAL | Status: DC
  Administered 2021-03-06 – 2021-03-08 (×3): 14 mg via TRANSDERMAL
  Filled 2021-03-05 (×3): qty 1

## 2021-03-05 MED ORDER — INSULIN ASPART 100 UNIT/ML ~~LOC~~ SOLN
0.0000 [IU] | SUBCUTANEOUS | Status: DC
  Administered 2021-03-05: 3 [IU] via SUBCUTANEOUS
  Administered 2021-03-05: 2 [IU] via SUBCUTANEOUS
  Administered 2021-03-06: 3 [IU] via SUBCUTANEOUS
  Administered 2021-03-06: 5 [IU] via SUBCUTANEOUS
  Administered 2021-03-06 (×2): 2 [IU] via SUBCUTANEOUS
  Administered 2021-03-06: 3 [IU] via SUBCUTANEOUS
  Administered 2021-03-07 (×2): 2 [IU] via SUBCUTANEOUS
  Administered 2021-03-07 (×2): 5 [IU] via SUBCUTANEOUS
  Administered 2021-03-07 – 2021-03-08 (×2): 2 [IU] via SUBCUTANEOUS
  Administered 2021-03-08: 5 [IU] via SUBCUTANEOUS

## 2021-03-05 MED ORDER — VITAL AF 1.2 CAL PO LIQD
1000.0000 mL | ORAL | Status: DC
  Administered 2021-03-05 – 2021-03-07 (×3): 1000 mL
  Filled 2021-03-05: qty 1000

## 2021-03-05 MED ORDER — SODIUM CHLORIDE 0.9 % IV SOLN: INTRAVENOUS | Status: DC

## 2021-03-05 MED ORDER — REVEFENACIN 175 MCG/3ML IN SOLN
175.0000 ug | Freq: Every day | RESPIRATORY_TRACT | Status: DC
  Administered 2021-03-06 – 2021-03-08 (×3): 175 ug via RESPIRATORY_TRACT
  Filled 2021-03-05 (×4): qty 3

## 2021-03-05 MED ORDER — LACTULOSE 10 GM/15ML PO SOLN
30.0000 g | Freq: Every day | ORAL | Status: DC
  Administered 2021-03-06 – 2021-03-08 (×3): 30 g
  Filled 2021-03-05 (×3): qty 45

## 2021-03-05 MED ORDER — FOLIC ACID 1 MG PO TABS
1.0000 mg | ORAL_TABLET | Freq: Every day | ORAL | Status: DC
Start: 2021-03-06 — End: 2021-03-08
  Administered 2021-03-06 – 2021-03-08 (×3): 1 mg
  Filled 2021-03-05 (×3): qty 1

## 2021-03-05 MED FILL — Albuterol Sulfate Soln Nebu 0.083% (2.5 MG/3ML): RESPIRATORY_TRACT | Qty: 3 | Status: AC

## 2021-03-05 NOTE — Progress Notes (Signed)
NAME:  Cassandra Riley, MRN:  [id number], DOB:  Sep 07, 1956, LOS: 1 ADMISSION DATE:  02/17/2021, CONSULTATION DATE: 03/04/2021 REFERRING MD:  Dr. Naida Sleight, [facility] CHIEF COMPLAINT:  AMS   History of Present Illness:  65 yo female smoker presented to [facility] on 3/15 with respiratory distress from COPD exacerbation.  She was also found to have acute systolic CHF with elevated troponin and transferred to [facility] on 3/16.  Developed worsening mental status and respiratory distress.  Required intubation and transferred to ICU.  Pertinent  Medical History  COPD on 3 liters O2, combined CHF, HTN, Chronic benzo use  Significant Hospital Events:  3/16 transfer from [facility], cardiology consulted 3/17 VDRF, transfer to ICU  Interim History / Subjective:  Remain on vent, sedation, pressors.  Objective   Blood pressure 131/86, pulse 81, temperature 98.3 F (36.8 C), temperature source Oral, resp. rate 18, height _0  (1.575 m), weight 51.4 kg, SpO2 99 %.    Vent Mode: PRVC FiO2 (%):  [40 %-60 %] 40 % Set Rate:  [18 bmp] 18 bmp Vt Set:  [350 mL] 350 mL PEEP:  [5 cmH20] 5 cmH20 Plateau Pressure:  [20 cmH20-21 cmH20] 20 cmH20   Intake/Output Summary (Last 24 hours) at 03/05/2021 1007 Last data filed at 03/05/2021 0800 Gross per 24 hour  Intake 899.2 ml  Output 960 ml  Net -60.8 ml   Filed Weights   02/28/2021 1947 03/04/21 0308 03/05/21 0420  Weight: 50.5 kg 50.8 kg 51.4 kg    Examination:  General - ill appearing Eyes - pupils reactive ENT - ETT in place Cardiac - regular rate/rhythm, no murmur Chest - b/l rhonchi Abdomen - soft, non tender, + bowel sounds Extremities - decreased muscle bulk Skin - no rashes Neuro - RASS between +1 and -2   Resolved Hospital Problem list     Assessment & Plan:   Acute on chronic hypoxic/hypercapnic respiratory failure from COPD exacerbation. Tobacco abuse. - not able to tolerate vent weaning at this time - brovana, yupelri, pulmicort - solumedrol 40 mg  bid - f/u CXR - nicotine patch  Acute systolic CHF with elevated troponin likely from demand ischemia. Acute on chronic cor pulmonale. Hx of HTN. - Echo 3/17 >> EF 20 to 25%, grade 1 DD, severe RV systolic dysfx - felt to not be candidate for coronary interventions per cardiology; signed off 3/18 - pressors to keep MAP > 65 - hold outpt lasix, HCTZ  Acute metabolic encephalopathy from hypoxia/hypercapnia. Chronic benzodiazepine use. - mild elevation in ammonia; doubt this is cause of AMS - EEG 3/17 >> generalized slowing - RASS goal 0 to -1 - change chronulac to 30 mg daily - hold outpt prozac, remeron  Oliguria. - continue IV fluids - f/u BMET   Best practice (evaluated daily)  Diet: tube feeds DVT prophylaxis: SQ heparin GI prophylaxis: protonix Mobility: bed rest Code Status: full code Disposition: ICU  Labs    CMP Latest Ref Rng & Units 03/05/2021 03/04/2021 03/04/2021  Glucose 70 - 99 mg/dL 90 - 145(H)  BUN 8 - 23 mg/dL 28(H) - 41(H)  Creatinine 0.44 - 1.00 mg/dL 0.82 - 1.23(H)  Sodium 135 - 145 mmol/L 142 140 143  Potassium 3.5 - 5.1 mmol/L 4.6 3.5 4.2  Chloride 98 - 111 mmol/L 96(L) - 96(L)  CO2 22 - 32 mmol/L 39(H) - 37(H)  Calcium 8.9 - 10.3 mg/dL 8.2(L) - 8.7(L)  Total Protein 6.5 - 8.1 g/dL - - -  Total Bilirubin 0.3 - 1.2  mg/dL - - -  Alkaline Phos 38 - 126 U/L - - -  AST 15 - 41 U/L - - -  ALT 0 - 44 U/L - - -    CBC Latest Ref Rng & Units 03/05/2021 03/04/2021 02/22/2021  WBC 4.0 - 10.5 K/uL 4.8 - 10.1  Hemoglobin 12.0 - 15.0 g/dL 11.1(L) 13.9 11.9(L)  Hematocrit 36.0 - 46.0 % 36.2 41.0 40.7  Platelets 150 - 400 K/uL 165 - 247    ABG    Component Value Date/Time   PHART 7.520 (H) 03/04/2021 1841   PCO2ART 56.0 (H) 03/04/2021 1841   PO2ART 126 (H) 03/04/2021 1841   HCO3 46.3 (H) 03/04/2021 1841   TCO2 48 (H) 03/04/2021 1841   O2SAT 99.0 03/04/2021 1841    CBG (last 3)  Recent Labs    03/04/21 1610 03/04/21 1945 03/05/21 0716  GLUCAP 89  85 91    Critical care time: 34 minutes  Chesley Mires, MD [place] Pager - [phone] 03/05/2021, 10:21 AM

## 2021-03-05 NOTE — Progress Notes (Signed)
PT Cancellation Note  Patient Details Name: Cassandra Riley MRN: [id number] DOB: Nov 26, 1956   Cancelled Treatment:    Reason Eval/Treat Not Completed: Patient not medically ready. Pt transferred to 57M from [facility] 3/17. Pt is now intubated and sedated. PT to follow and proceed with eval when medically ready.   Cassandra Riley 03/05/2021, 8:53 AM   Lorrin Goodell, PT  Office # [phone] Pager [phone]

## 2021-03-05 NOTE — Progress Notes (Signed)
OT Cancellation Note  Patient Details Name: Cassandra Riley MRN: [id number] DOB: Sep 12, 1956   Cancelled Treatment:    Reason Eval/Treat Not Completed: Patient not medically ready. Pt transferred to 55M from [facility] 3/17. Obtunded/intuibated/sedated. Will follow acutely and assess when medically appropriate.     Ramond Dial, OT/L   Acute OT Clinical Specialist Acute Rehabilitation Services Pager [phone] Office [phone]  03/05/2021, 8:31 AM

## 2021-03-05 NOTE — Progress Notes (Signed)
Tried calling pt's daughter at listed number.  No answer.  Chesley Mires, MD [place] Pager - [phone] 03/05/2021, 12:15 PM

## 2021-03-05 NOTE — Progress Notes (Signed)
eLink Physician-Brief Progress Note Patient Name: Cassandra Riley DOB: March 21, 1956 MRN: [id number]   Date of Service  03/05/2021  HPI/Events of Note  Hyperglycemia - Blood glucose = 208. Patient on tube feeds and Solumedrol   eICU Interventions  Plan: 1. Q 4 hour moderate Novolog SSI.     Intervention Category Major Interventions: Hyperglycemia - active titration of insulin therapy  Lysle Dingwall 03/05/2021, 9:01 PM

## 2021-03-05 NOTE — Consult Note (Signed)
Consultation Note Date: 03/05/2021   Patient Name: Cassandra Riley  DOB: 11-Apr-1956  MRN: [id number]  Age / Sex: 65 y.o., female  PCP: Neale Burly, MD Referring Physician: Chesley Mires, MD  Reason for Consultation: Establishing goals of care  HPI/Patient Profile: 65 y.o. female  with past medical history of severe COPD on home supplemental oxygen, CHF admitted on 02/24/2021 with acute respiratory failure likely due to an acute COPD exacerbation.  She was also found to have a new reduction in LVEF ( EF 20%).  Patient was placed on BiPAP initially but unable to tolerate due to altered mental status.  She was then intubated on 03/04/2021 and transferred to the ICU.  Palliative team was consulted for goals of care and CODE STATUS conversation.  Clinical Assessment and Goals of Care:  I have reviewed medical records from progress notes, labs, imaging and specialist notes.  Cassandra Kaufman, NP and I called and spoke to patient's daughter, Cassandra Riley on the phone.  We introduced ourselves palliative team, which is a specialized medical care focusing on symptom management and advanced care plan for patient with serious illnesses.  Cassandra Riley states that patient has been very short of breath in the last few weeks but she was doing better before that.  She typically felt bad for 3-4 days and then felt better.  She lives at home with her partner of 20+ year but he passed away a few months ago.  She is quite independent with ADLs but daughter comes by daily to help with cleaning.  Her daughter, patient can self administer her medications.  Patient is still smoking.   We explained to [name] that patient has an acute respiratory failure likely from her COPD exacerbation.  We also found a new significant reduction in patient's EF.  Patient is currently intubated due to inability to tolerate BiPAP.  Cassandra Riley understands that her longterm  prognosis is guarded.   Per chart review, patient was DNR in the past but then converted back to full code.  Cassandra Riley states that she does not know if patient has signed a DNR form for the past.  It looks like patient has not found an advanced directive form or appointed in healthcare power of attorney.  Cassandra Riley states that patient would not wish to be on life support if she has a terminal condition such as brain death.  We explained to [name] what DNR means and it may cause more suffering than good.  Cassandra Riley verbalizes understanding and wishes to continue with full code and full scope of treatment.  We will continue to monitor patient the next few days.  If she is not able to be extubated, will revisit goals of care conversation.  Primary Decision Maker NEXT OF KIN-patient's daughter, Cassandra Riley     SUMMARY OF RECOMMENDATIONS    -Continue Full code/Full scope of treatment per daughter's wishes -Will monitor her progress over the next few days.  If she is not able to be extubated, and will revisit goals of care conversation. -Patient  did not have an advanced directive or healthcare power of attorney appointed.  Her daughter, Cassandra Riley, will be the healthcare agent.  Code Status/Advance Care Planning:  Full code    Symptom Management:   Fetenyl and Precedex   Palliative Prophylaxis:   Frequent Pain Assessment  Additional Recommendations (Limitations, Scope, Preferences):  Full Scope Treatment  Prognosis:    Unable to determine  Discharge Planning: To Be Determined  Primary Diagnoses: Present on Admission: . Chronic respiratory failure with hypoxia ([name]) . Acute on chronic diastolic CHF (congestive heart failure) ([name]) . COPD with acute exacerbation ([name]) . Benzodiazepine dependence ([name])   I have reviewed the medical record, interviewed the patient and family, and examined the patient. The following aspects are pertinent.  Past Medical History:  Diagnosis Date  . CHF  (congestive heart failure) ([name])   . Chronic respiratory failure with hypoxia ([facility])    3L  . COPD (chronic obstructive pulmonary disease) (H[facility])    Social History   Socioeconomic History  . Marital status: Single    Spouse name: Not on file  . Number of children: Not on file  . Years of education: Not on file  . Highest education level: Not on file  Occupational History  . Not on file  Tobacco Use  . Smoking status: Current Every Day Smoker    Types: Cigarettes  . Smokeless tobacco: Not on file  Substance and Sexual Activity  . Alcohol use: Never  . Drug use: Yes    Types: Benzodiazepines  . Sexual activity: Not on file  Other Topics Concern  . Not on file  Social History Narrative  . Not on file   Social Determinants of Health   Financial Resource Strain: Not on file  Food Insecurity: Not on file  Transportation Needs: Not on file  Physical Activity: Not on file  Stress: Not on file  Social Connections: Not on file   Scheduled Meds: . arformoterol  15 mcg Nebulization BID  . budesonide (PULMICORT) nebulizer solution  0.5 mg Nebulization BID  . chlorhexidine gluconate (MEDLINE KIT)  15 mL Mouth Rinse BID  . Chlorhexidine Gluconate Cloth  6 each Topical Daily  . [START ON 5/88/5027] folic acid  1 mg Per Tube Daily  . heparin injection (subcutaneous)  5,000 Units Subcutaneous Q8H  . [START ON 03/06/2021] lactulose  30 g Per Tube Daily  . mouth rinse  15 mL Mouth Rinse 10 times per day  . methylPREDNISolone (SOLU-MEDROL) injection  40 mg Intravenous Q12H  . [START ON 03/06/2021] multivitamin with minerals  1 tablet Per Tube Daily  . [START ON 03/06/2021] nicotine  14 mg Transdermal Daily  . oxybutynin  5 mg Per Tube TID  . pantoprazole sodium  40 mg Per Tube Daily  . revefenacin  175 mcg Nebulization Daily  . [START ON 03/06/2021] thiamine  100 mg Per Tube Daily   Or  . [START ON 03/06/2021] thiamine  100 mg Intravenous Daily   Continuous Infusions: . sodium chloride  Stopped (03/04/21 1605)  . sodium chloride 50 mL/hr at 03/05/21 1200  . azithromycin 500 mg (03/04/21 1848)  . dexmedetomidine (PRECEDEX) IV infusion 0.4 mcg/kg/hr (03/05/21 1200)  . feeding supplement (VITAL AF 1.2 CAL) 1,000 mL (03/05/21 1131)  . fentaNYL infusion INTRAVENOUS 50 mcg/hr (03/05/21 1200)  . norepinephrine (LEVOPHED) Adult infusion 1 mcg/min (03/05/21 1200)  . propofol (DIPRIVAN) infusion Stopped (03/04/21 1735)   PRN Meds:.fentaNYL, levalbuterol, midazolam, polyethylene glycol Medications Prior to Admission:  Prior to  Admission medications   Medication Sig Start Date End Date Taking? Authorizing Provider  ANORO ELLIPTA 62.5-25 MCG/INH AEPB Inhale 1 puff into the lungs daily. 02/27/21  Yes [provider]  Aspirin-Salicylamide-Caffeine (BC HEADACHE POWDER PO) Take 1 packet by mouth 3 (three) times daily.   Yes [provider]  FLUoxetine (PROZAC) 20 MG capsule Take 20 mg by mouth daily. 12/18/20  Yes [provider]  furosemide (LASIX) 40 MG tablet Take 40 mg by mouth daily.   Yes [provider]  hydrochlorothiazide (HYDRODIURIL) 25 MG tablet Take 25 mg by mouth daily. 02/27/21  Yes [provider]  ipratropium-albuterol (DUONEB) 0.5-2.5 (3) MG/3ML SOLN Take 3 mLs by nebulization every 4 (four) hours. 02/08/21  Yes [provider]  LORazepam (ATIVAN) 1 MG tablet Take 1 mg by mouth in the morning and at bedtime. 02/27/21  Yes [provider]  PROAIR HFA 108 (90 Base) MCG/ACT inhaler Inhale 2 puffs into the lungs every 6 (six) hours as needed for wheezing or shortness of breath.   Yes [provider]  atorvastatin (LIPITOR) 80 MG tablet Take 80 mg by mouth daily. Patient not taking: Reported on 03/04/2021 02/27/21   [provider]  fexofenadine (ALLEGRA) 180 MG tablet Take 180 mg by mouth daily. 02/08/21   [provider]  mirtazapine (REMERON) 15 MG tablet Take 15 mg by mouth at bedtime. 02/22/21    [provider]   Allergies  Allergen Reactions  . Morphine And Related Shortness Of Breath and Nausea And Vomiting  . Morphine Sulfate Shortness Of Breath and Other (See Comments)    Pt reports hypoxia with COPD  . Atorvastatin Other (See Comments)    Stopped taking due to unwanted side effects  . Fosamax [Alendronate] Other (See Comments)    Caused leg cramps   Review of Systems  Constitutional:       Intubated    Physical Exam Constitutional:      Comments: Sedated, intubated, on pressor supports     Vital Signs: BP 116/73   Pulse 78   Temp 98.2 F (36.8 C) (Axillary)   Resp 18   Ht _0  (1.575 m)   Wt 51.4 kg   SpO2 97%   BMI 20.73 kg/m  Pain Scale: CPOT   Pain Score: 0-No pain   SpO2: SpO2: 97 % O2 Device:SpO2: 97 % O2 Flow Rate: .O2 Flow Rate (L/min): 15 L/min  IO: Intake/output summary:   Intake/Output Summary (Last 24 hours) at 03/05/2021 1303 Last data filed at 03/05/2021 1200 Gross per 24 hour  Intake 982.93 ml  Output 900 ml  Net 82.93 ml    LBM: Last BM Date: 02/19/2021 Baseline Weight: Weight: 50.5 kg Most recent weight: Weight: 51.4 kg     Palliative Assessment/Data: 10%     Thank you for this consult. Palliative medicine will continue to follow and assist as needed.   Time In: 0928 Time Out: 1053 Time Total: 85 mins min   Greater than 50%  of this time was spent counseling and coordinating care related to the above assessment and plan.  Signed by: Cassandra Riley, AGNP-C Palliative Medicine  Gaylan Gerold, DO Internal Medicine    Please contact Palliative Medicine Team phone at [phone] for questions and concerns.  For individual provider: See Shea Evans

## 2021-03-05 NOTE — Progress Notes (Signed)
Nutrition Follow-up  RD working remotely.  DOCUMENTATION CODES:   Not applicable  INTERVENTION:   Initiate tube feeding via OG tube: - Vital AF 1.2 @ 45 ml/hr (1080 ml/day)  Tube feeding regimen provides 1296 kcal, 81 grams of protein, and 876 ml of H2O.   NUTRITION DIAGNOSIS:   Increased nutrient needs related to chronic illness (CHF, COPD) as evidenced by estimated needs.  Ongoing  GOAL:   Patient will meet greater than or equal to 90% of their needs  Met via TF  MONITOR:   PO intake,Supplement acceptance,Skin,I & O's,Labs,Weight trends  REASON FOR ASSESSMENT:   Consult Assessment of nutrition requirement/status  ASSESSMENT:   37 YOF admitted for CHF. PMH of COPD, CHF, chronic respiratory failure with hypoxia on 3L O2, benzodiazepine dependence.  3/17 - intubated, transferred to ICU  RD consulted for tube feeding intiation and management. Pt with OG tube in place.  Fentanyl and levophed off this AM per MAR. Pt remains on precedex. Suspect some degree of malnutrition is present but unable to confirm at this time. Noted Palliative Care has been consulted.  Patient is currently intubated on ventilator support MV: 6.2 L/min Temp (24hrs), Avg:98.8 F (37.1 C), Min:96.1 F (35.6 C), Max:100.1 F (37.8 C) BP (cuff): 111/70 MAP (cuff): 83  Drips: Precedex  Medications reviewed and include: colace, folic acid, lactulose, IV solu-medrol, MVI with minerals, protonix, miralax, thiamine, IV abx  Labs reviewed: BUN 28 CBG's: 85-91  UOP: 1210 ml x 24 hours I/O's: -2.2 L since admission  NUTRITION - FOCUSED PHYSICAL EXAM:  Unable to complete at this time. RD working remotely.  Diet Order:   Diet Order    None      EDUCATION NEEDS:   No education needs have been identified at this time  Skin:  Skin Assessment: Reviewed RN Assessment  Last BM:  03/01/2021  Height:   Ht Readings from Last 1 Encounters:  03/04/2021 _0  (1.575 m)    Weight:   Wt  Readings from Last 1 Encounters:  03/05/21 51.4 kg    Ideal Body Weight:  52.3 kg  BMI:  Body mass index is 20.73 kg/m.  Estimated Nutritional Needs:   Kcal:  1275  Protein:  80-95 g  Fluid:  >/= 1.6 L    Gustavus Bryant, MS, RD, LDN Inpatient Clinical Dietitian Please see AMiON for contact information.

## 2021-03-05 NOTE — Progress Notes (Addendum)
Progress Note  Patient Name: Cassandra Riley Date of Encounter: 03/05/2021  [facility] Cardiologist: Ena Dawley, MD   Subjective   Intubated, sedated.  Inpatient Medications    Scheduled Meds: . arformoterol  15 mcg Nebulization BID  . budesonide (PULMICORT) nebulizer solution  0.5 mg Nebulization BID  . chlorhexidine gluconate (MEDLINE KIT)  15 mL Mouth Rinse BID  . Chlorhexidine Gluconate Cloth  6 each Topical Daily  . docusate  100 mg Per Tube BID  . folic acid  1 mg Oral Daily  . heparin injection (subcutaneous)  5,000 Units Subcutaneous Q8H  . lactulose  30 g Oral TID  . mouth rinse  15 mL Mouth Rinse 10 times per day  . methylPREDNISolone (SOLU-MEDROL) injection  20 mg Intravenous Daily  . multivitamin with minerals  1 tablet Oral Daily  . nicotine  21 mg Transdermal Daily  . oxybutynin  5 mg Oral TID  . pantoprazole sodium  40 mg Per Tube Daily  . polyethylene glycol  17 g Per Tube Daily  . thiamine  100 mg Oral Daily   Or  . thiamine  100 mg Intravenous Daily   Continuous Infusions: . sodium chloride Stopped (03/04/21 1606)  . sodium chloride Stopped (03/04/21 1605)  . azithromycin 500 mg (03/04/21 1848)  . dexmedetomidine (PRECEDEX) IV infusion 0.4 mcg/kg/hr (03/05/21 0800)  . fentaNYL infusion INTRAVENOUS Stopped (03/05/21 0500)  . norepinephrine (LEVOPHED) Adult infusion Stopped (03/05/21 0731)  . propofol (DIPRIVAN) infusion Stopped (03/04/21 1735)   PRN Meds: fentaNYL, levalbuterol   Vital Signs    Vitals:   03/05/21 0745 03/05/21 0800 03/05/21 0815 03/05/21 0830  BP:  (!) 80/56 (!) 65/46 131/86  Pulse: 77 70 (!) 51 81  Resp: (!) _0 Temp:      TempSrc:      SpO2: 100% 97% 97% 99%  Weight:      Height:        Intake/Output Summary (Last 24 hours) at 03/05/2021 0959 Last data filed at 03/05/2021 0800 Gross per 24 hour  Intake 899.2 ml  Output 960 ml  Net -60.8 ml   Last 3 Weights 03/05/2021 03/04/2021 03/07/2021  Weight  (lbs) 113 lb 5.1 oz 111 lb 15.9 oz 111 lb 5.3 oz  Weight (kg) 51.4 kg 50.8 kg 50.5 kg      Telemetry    SR, 90' - Personally Reviewed  ECG    No new tracing - Personally Reviewed  Physical Exam  Intubated, sedated Neck: + 6 cm Cardiac: RRR, no murmurs, rubs, or gallops.  Respiratory: Clear to auscultation bilaterally. GI: Soft, nontender, non-distended  MS: No edema; No deformity.   Labs    High Sensitivity Troponin:   Recent Labs  Lab 02/16/2021 2043 02/27/2021 2202  TROPONINIHS 720* 679*      Chemistry Recent Labs  Lab 03/05/2021 2043 03/04/21 0029 03/04/21 1841 03/05/21 0458  NA 143 143 140 142  K 3.7 4.2 3.5 4.6  CL 98 96*  --  96*  CO2 36* 37*  --  39*  GLUCOSE 101* 145*  --  90  BUN 40* 41*  --  28*  CREATININE 1.25* 1.23*  --  0.82  CALCIUM 8.6* 8.7*  --  8.2*  PROT 6.1*  --   --   --   ALBUMIN 3.4*  --   --   --   AST 31  --   --   --   ALT 37  --   --   --  ALKPHOS 202*  --   --   --   BILITOT 1.1  --   --   --   GFRNONAA 48* 49*  --  >60  ANIONGAP 9 10  --  7     Hematology Recent Labs  Lab 03/01/2021 2043 03/04/21 1841 03/05/21 0458  WBC 10.1  --  4.8  RBC 4.49  --  4.17  HGB 11.9* 13.9 11.1*  HCT 40.7 41.0 36.2  MCV 90.6  --  86.8  MCH 26.5  --  26.6  MCHC 29.2*  --  30.7  RDW 22.5*  --  22.0*  PLT 247  --  165    BNP Recent Labs  Lab 03/17/2021 2043  BNP >4,500.0*     DDimer No results for input(s): DDIMER in the last 168 hours.   Radiology    DG Chest 1 View  Result Date: 03/05/2021 CLINICAL DATA:  Endotracheal tube EXAM: CHEST  1 VIEW COMPARISON:  Chest x-ray 03/04/2021 FINDINGS: Endotracheal tube with tip terminating 3 cm above the carina. Enteric tube coursing below the hemidiaphragm with tip and side port collimated off view. The heart size and mediastinal contours are unchanged. Hyperinflation consistent with known emphysematous changes. Coarsened interstitial markings. No focal consolidation. No pulmonary edema. Trace  left pleural effusion. No pneumothorax. No acute osseous abnormality. Old healed left rib fractures. Likely old healed right humeral neck fracture. IMPRESSION: 1. Trace left pleural effusion. 2. Endotracheal tube in stable position. 3. Enteric tube coursing below the hemidiaphragm with tip and side port collimated off view. Electronically Signed   By: Iven Finn M.D.   On: 03/05/2021 06:16   DG CHEST PORT 1 VIEW  Result Date: 03/04/2021 CLINICAL DATA:  Endotracheal and orogastric tube placement. EXAM: PORTABLE CHEST 1 VIEW COMPARISON:  Radiograph earlier this day. FINDINGS: The apices are not included in the field of view, not repeated at the request of the referring provider. Endotracheal tube tip is approximately 3.4 cm from the carina. Tip and side port of the enteric tube below the diaphragm in the stomach. Cardiomegaly is less prominent than on earlier today. Improved vascular congestion. Chronic bronchial thickening and hyperinflation. No new pulmonary opacity. Remote left rib fractures. IMPRESSION: 1. Endotracheal tube tip approximately 3.4 cm from the carina. Enteric tube tip and side-port below the diaphragm in the stomach. 2. Hyperinflation and bronchial thickening suggesting COPD. 3. Improved cardiomegaly from earlier today. Electronically Signed   By: Keith Rake M.D.   On: 03/04/2021 16:23   DG CHEST PORT 1 VIEW  Result Date: 03/04/2021 CLINICAL DATA:  Hypoxia short of breath EXAM: PORTABLE CHEST 1 VIEW COMPARISON:  03/02/2021 FINDINGS: Cardiomegaly with vascular congestion. No consolidation or pleural effusion. No pneumothorax. Chronic deformity at the right humeral head. IMPRESSION: Cardiomegaly with vascular congestion. Electronically Signed   By: Donavan Foil M.D.   On: 03/04/2021 00:17   EEG adult  Result Date: 03/04/2021  ABNORMALITY - Continuous slow, generalized IMPRESSION: This study is suggestive of moderate to severe diffuse encephalopathy, nonspecific etiology but  could be related to sedation. No seizures or epileptiform discharges were seen throughout the recording. Lora Havens   ECHOCARDIOGRAM COMPLETE  Result Date: 03/04/2021   IMPRESSIONS  1. Left ventricular ejection fraction, by estimation, is 20 to 25%. The left ventricle has severely decreased function. The left ventricle demonstrates global hypokinesis. The left ventricular internal cavity size was mildly dilated. Left ventricular diastolic parameters are consistent with Grade I diastolic dysfunction (impaired relaxation).  2.  Right ventricular systolic function is severely reduced. The right ventricular size is normal.  3. The mitral valve is grossly normal. No evidence of mitral valve regurgitation.  4. The aortic valve is calcified. There is moderate calcification of the aortic valve. There is moderate thickening of the aortic valve. Aortic valve regurgitation is not visualized.   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 She was able to quit smoking in 2017, but restarted 3 months ago. She has also been abusing benzodiazepines by taking leftover medications after relatives have died.   She presented to [facility] 01/20/21 with COPD exacerbation. She was placed on BiPAP overnight with improvement, ABG with CO2 retention. Pt was still an active smoker. CXR with concern for malignancy.   Echo 01/21/21 showed normal EF, grade 1 DD, severely dilated RV with moderately reduced RV systolic function, mild pulmonary hypertension. Benzodiazepine use suspected secondary to end stage COPD and anxiety with dyspnea.  On physical exam the patient is on BiPAP and not arousable, her O2 sats prior to initiation of BiPAP were 78% currently 95%, she appears cyanotic, with warm extremities, no crackles on her lungs, regular cardiac beats with no obvious murmur.  Her blood pressure is  78/52, and she is being actively transferred to the ICU. Her blood gases show pH of 7.2, PCO2 of 101, O2 sats 96%. HS troponin 720 --> 679, EKG with sinus tachycardia with RBBB   Assessment & Plan    Acute on chronic hypercapnic respiratory failure RA strain with dilatation and dysfunction and pulmonary hypertension.  Minimal troponin elevation in the settings of hypoxia representing demand ischemia The patient does not have symptoms of chest pain and in the setting of end-stage COPD and possible lung malignancy she is not a candidate for any invasive interventions.  The patient required intubation yesterday currently in ICU, telemetry shows normal sinus rhythm with ventricular rates in 90s, blood pressure low, managed by PCM does not appear to be fluid overloaded on physical exam but rather dry, BP centimeters is managing underlying disease, will watch from the distance but currently does not need any cardiac work-up or diuresis.  I would continue to hold diuretics. Her EEG shows moderate to severe encephalopathy. We do not plan any invasive management, will sign off, please call us with any questions.  For questions or updates, please contact [facility] Please consult [email or web address] for contact info under     Signed, Ena Dawley, MD  03/05/2021, 9:59 AM

## 2021-03-06 ENCOUNTER — Inpatient Hospital Stay (HOSPITAL_COMMUNITY): Payer: 59

## 2021-03-06 DIAGNOSIS — J441 Chronic obstructive pulmonary disease with (acute) exacerbation: Secondary | ICD-10-CM | POA: Diagnosis not present

## 2021-03-06 DIAGNOSIS — J9621 Acute and chronic respiratory failure with hypoxia: Secondary | ICD-10-CM | POA: Diagnosis not present

## 2021-03-06 DIAGNOSIS — I5043 Acute on chronic combined systolic (congestive) and diastolic (congestive) heart failure: Secondary | ICD-10-CM | POA: Diagnosis not present

## 2021-03-06 DIAGNOSIS — J9622 Acute and chronic respiratory failure with hypercapnia: Secondary | ICD-10-CM | POA: Diagnosis not present

## 2021-03-06 LAB — CBC
HCT: 39.6 % (ref 36.0–46.0)
Hemoglobin: 12.3 g/dL (ref 12.0–15.0)
MCH: 27.1 pg (ref 26.0–34.0)
MCHC: 31.1 g/dL (ref 30.0–36.0)
MCV: 87.2 fL (ref 80.0–100.0)
Platelets: 220 10*3/uL (ref 150–400)
RBC: 4.54 MIL/uL (ref 3.87–5.11)
RDW: 22.7 % — ABNORMAL HIGH (ref 11.5–15.5)
WBC: 9.8 10*3/uL (ref 4.0–10.5)
nRBC: 0 % (ref 0.0–0.2)

## 2021-03-06 LAB — BASIC METABOLIC PANEL
Anion gap: 7 (ref 5–15)
BUN: 28 mg/dL — ABNORMAL HIGH (ref 8–23)
CO2: 37 mmol/L — ABNORMAL HIGH (ref 22–32)
Calcium: 8.5 mg/dL — ABNORMAL LOW (ref 8.9–10.3)
Chloride: 97 mmol/L — ABNORMAL LOW (ref 98–111)
Creatinine, Ser: 0.69 mg/dL (ref 0.44–1.00)
GFR, Estimated: >60 mL/min (ref >60)
Glucose, Bld: 117 mg/dL — ABNORMAL HIGH (ref 70–99)
Potassium: 4.7 mmol/L (ref 3.5–5.1)
Sodium: 141 mmol/L (ref 135–145)

## 2021-03-06 LAB — HEMOGLOBIN A1C
Hgb A1c MFr Bld: 6.6 % — ABNORMAL HIGH (ref 4.8–5.6)
Mean Plasma Glucose: 142.72 mg/dL

## 2021-03-06 LAB — GLUCOSE, CAPILLARY
Glucose-Capillary: 120 mg/dL — ABNORMAL HIGH (ref 70–99)
Glucose-Capillary: 137 mg/dL — ABNORMAL HIGH (ref 70–99)
Glucose-Capillary: 145 mg/dL — ABNORMAL HIGH (ref 70–99)
Glucose-Capillary: 155 mg/dL — ABNORMAL HIGH (ref 70–99)
Glucose-Capillary: 184 mg/dL — ABNORMAL HIGH (ref 70–99)
Glucose-Capillary: 208 mg/dL — ABNORMAL HIGH (ref 70–99)

## 2021-03-06 LAB — MAGNESIUM: Magnesium: 2.3 mg/dL (ref 1.7–2.4)

## 2021-03-06 LAB — PHOSPHORUS: Phosphorus: 2.8 mg/dL (ref 2.5–4.6)

## 2021-03-06 MED ORDER — WHITE PETROLATUM EX OINT
TOPICAL_OINTMENT | CUTANEOUS | Status: DC | PRN
  Administered 2021-03-06: 0.2 via TOPICAL

## 2021-03-06 MED ORDER — MIRTAZAPINE 15 MG PO TABS
15.0000 mg | ORAL_TABLET | Freq: Every day | ORAL | Status: DC
  Administered 2021-03-06 – 2021-03-07 (×2): 15 mg
  Filled 2021-03-06 (×2): qty 1

## 2021-03-06 MED ORDER — FLUOXETINE HCL 20 MG PO CAPS: 20.0000 mg | ORAL_CAPSULE | Freq: Every day | ORAL | Status: DC

## 2021-03-06 MED ORDER — FLUOXETINE HCL 20 MG PO CAPS
20.0000 mg | ORAL_CAPSULE | Freq: Every day | ORAL | Status: DC
  Administered 2021-03-06 – 2021-03-08 (×3): 20 mg
  Filled 2021-03-06 (×3): qty 1

## 2021-03-06 MED ORDER — MIRTAZAPINE 15 MG PO TABS: 15.0000 mg | ORAL_TABLET | Freq: Every day | ORAL | Status: DC

## 2021-03-06 NOTE — Progress Notes (Signed)
Spoke with pt's daughter at bedside.  Updated about current status and treatment plan.  Chesley Mires, MD [place] Pager - [phone] 03/06/2021, 2:25 PM

## 2021-03-06 NOTE — Progress Notes (Addendum)
NAME:  Cassandra Riley, MRN:  [id number], DOB:  [birth year], LOS: 2 ADMISSION DATE:  02/20/2021, CONSULTATION DATE: 03/04/2021 REFERRING MD:  Dr. Naida Sleight, [facility] CHIEF COMPLAINT:  AMS   History of Present Illness:  65 yo female smoker presented to [facility] on 3/15 with respiratory distress from COPD exacerbation.  She was also found to have acute systolic CHF with elevated troponin and transferred to [facility] on 3/16.  Developed worsening mental status and respiratory distress.  Required intubation and transferred to ICU.  Pertinent  Medical History  COPD on 3 liters O2, combined CHF, HTN, Chronic benzo use  Significant Hospital Events:  3/16 transfer from [facility], cardiology consulted 3/17 VDRF, transfer to ICU  Interim History / Subjective:  Intermittently agitated overnight.  Remains on sedation, pressors.  Objective   Blood pressure 126/86, pulse 78, temperature 99 F (37.2 C), temperature source Axillary, resp. rate 18, height _0  (1.575 m), weight 49.3 kg, SpO2 97 %.    Vent Mode: PRVC FiO2 (%):  [40 %] 40 % Set Rate:  [18 bmp] 18 bmp Vt Set:  [350 mL] 350 mL PEEP:  [5 cmH20] 5 cmH20 Plateau Pressure:  [14 cmH20-18 cmH20] 14 cmH20   Intake/Output Summary (Last 24 hours) at 03/06/2021 0758 Last data filed at 03/06/2021 6761 Gross per 24 hour  Intake 2699.24 ml  Output 515 ml  Net 2184.24 ml   Filed Weights   03/04/21 0308 03/05/21 0420 03/06/21 0500  Weight: 50.8 kg 51.4 kg 49.3 kg    Examination:  General - sedated Eyes - pupils reactive ENT - ETT in place Cardiac - regular rate/rhythm, no murmur Chest - b/l rhonchi Abdomen - soft, non tender, + bowel sounds Extremities - no cyanosis, clubbing, or edema Skin - no rashes Neuro - RASS -2   Resolved Hospital Problem list     Active Hospital Problem list   Acute on chronic hypoxic/hypercapnic respiratory failure from COPD exacerbation. Tobacco abuse. Data:  ETT 3/17 >> Zithromax 3/17 >>    Plan:  - not able to  tolerate vent weaning at this time - continue brovana, yupelri, pulmicort - prn xopenex - continue solumedrol 40 mg bid - f/u CXR intermittently - nicotine patch - day 3/5 of zithromax    Acute systolic CHF with elevated troponin likely from demand ischemia. Acute on chronic cor pulmonale. Hx of HTN. Data:  Echo 3/17 >> EF 20 to 25%, grade 1 DD, severe RV systolic dysfx   Plan:  - felt to not be candidate for coronary interventions per cardiology; signed off 3/18 - wean pressors to keep MAP > 65 - hold outpt lasix, HCTZ    Acute metabolic encephalopathy from hypoxia/hypercapnia. Chronic benzodiazepine use. Data:  EEG 3/17 >> generalized slowing   Plan:  - mild elevation in ammonia; doubt this is cause of AMS - RASS goal 0 to -1 - continue chronulac 30 mg daily - resume outpt prozac, remeron    Oliguria with hematuria. Data:     Plan:  - continue IV fluids - f/u BMET - check urine culture - hold SQ heparin for now    Goals of care. Data:     Plan:  - palliative care consulted     Best practice (evaluated daily)  Diet: tube feeds DVT prophylaxis: SCDs GI prophylaxis: protonix Mobility: bed rest Code Status: full code Disposition: ICU  Labs    CMP Latest Ref Rng & Units 03/06/2021 03/05/2021 03/04/2021  Glucose 70 - 99 mg/dL 117(H) 90 -  BUN 8 - 23 mg/dL 28(H) 28(H) -  Creatinine 0.44 - 1.00 mg/dL 0.69 0.82 -  Sodium 135 - 145 mmol/L 141 142 140  Potassium 3.5 - 5.1 mmol/L 4.7 4.6 3.5  Chloride 98 - 111 mmol/L 97(L) 96(L) -  CO2 22 - 32 mmol/L 37(H) 39(H) -  Calcium 8.9 - 10.3 mg/dL 8.5(L) 8.2(L) -  Total Protein 6.5 - 8.1 g/dL - - -  Total Bilirubin 0.3 - 1.2 mg/dL - - -  Alkaline Phos 38 - 126 U/L - - -  AST 15 - 41 U/L - - -  ALT 0 - 44 U/L - - -    CBC Latest Ref Rng & Units 03/06/2021 03/05/2021 03/04/2021  WBC 4.0 - 10.5 K/uL 9.8 4.8 -  Hemoglobin 12.0 - 15.0 g/dL 12.3 11.1(L) 13.9  Hematocrit 36.0 - 46.0 % 39.6 36.2 41.0  Platelets 150  - 400 K/uL 220 165 -    ABG    Component Value Date/Time   PHART 7.520 (H) 03/04/2021 1841   PCO2ART 56.0 (H) 03/04/2021 1841   PO2ART 126 (H) 03/04/2021 1841   HCO3 46.3 (H) 03/04/2021 1841   TCO2 48 (H) 03/04/2021 1841   O2SAT 99.0 03/04/2021 1841    CBG (last 3)  Recent Labs    03/05/21 2335 03/06/21 0322 03/06/21 0724  GLUCAP 133* 137* 184*    Critical care time: 33 minutes  Chesley Mires, MD [place] Pager - [phone] 03/06/2021, 7:58 AM

## 2021-03-06 NOTE — Progress Notes (Signed)
Writer noted intermittent posturing of bilateral upper and lower extremities and hematuria. Elink made aware, no new orders.

## 2021-03-07 DIAGNOSIS — J9621 Acute and chronic respiratory failure with hypoxia: Secondary | ICD-10-CM | POA: Diagnosis not present

## 2021-03-07 DIAGNOSIS — J9622 Acute and chronic respiratory failure with hypercapnia: Secondary | ICD-10-CM | POA: Diagnosis not present

## 2021-03-07 DIAGNOSIS — J441 Chronic obstructive pulmonary disease with (acute) exacerbation: Secondary | ICD-10-CM | POA: Diagnosis not present

## 2021-03-07 LAB — CBC
HCT: 41.5 % (ref 36.0–46.0)
Hemoglobin: 12.3 g/dL (ref 12.0–15.0)
MCH: 26.5 pg (ref 26.0–34.0)
MCHC: 29.6 g/dL — ABNORMAL LOW (ref 30.0–36.0)
MCV: 89.2 fL (ref 80.0–100.0)
Platelets: 218 10*3/uL (ref 150–400)
RBC: 4.65 MIL/uL (ref 3.87–5.11)
RDW: 22.9 % — ABNORMAL HIGH (ref 11.5–15.5)
WBC: 11.1 10*3/uL — ABNORMAL HIGH (ref 4.0–10.5)
nRBC: 0 % (ref 0.0–0.2)

## 2021-03-07 LAB — BASIC METABOLIC PANEL
Anion gap: 6 (ref 5–15)
BUN: 31 mg/dL — ABNORMAL HIGH (ref 8–23)
CO2: 35 mmol/L — ABNORMAL HIGH (ref 22–32)
Calcium: 8.5 mg/dL — ABNORMAL LOW (ref 8.9–10.3)
Chloride: 99 mmol/L (ref 98–111)
Creatinine, Ser: 0.64 mg/dL (ref 0.44–1.00)
GFR, Estimated: >60 mL/min (ref >60)
Glucose, Bld: 185 mg/dL — ABNORMAL HIGH (ref 70–99)
Potassium: 5.3 mmol/L — ABNORMAL HIGH (ref 3.5–5.1)
Sodium: 140 mmol/L (ref 135–145)

## 2021-03-07 LAB — GLUCOSE, CAPILLARY
Glucose-Capillary: 206 mg/dL — ABNORMAL HIGH (ref 70–99)
Glucose-Capillary: 150 mg/dL — ABNORMAL HIGH (ref 70–99)
Glucose-Capillary: 213 mg/dL — ABNORMAL HIGH (ref 70–99)
Glucose-Capillary: 147 mg/dL — ABNORMAL HIGH (ref 70–99)
Glucose-Capillary: 144 mg/dL — ABNORMAL HIGH (ref 70–99)
Glucose-Capillary: 113 mg/dL — ABNORMAL HIGH (ref 70–99)

## 2021-03-07 LAB — URINE CULTURE: Culture: <10000 — AB

## 2021-03-07 MED ORDER — FENTANYL BOLUS VIA INFUSION
50.0000 ug | INTRAVENOUS | Status: DC | PRN
  Filled 2021-03-07: qty 50

## 2021-03-07 MED ORDER — INSULIN DETEMIR 100 UNIT/ML ~~LOC~~ SOLN
5.0000 [IU] | Freq: Every day | SUBCUTANEOUS | Status: DC
  Administered 2021-03-07 – 2021-03-08 (×2): 5 [IU] via SUBCUTANEOUS
  Filled 2021-03-07 (×2): qty 0.05

## 2021-03-07 MED ORDER — MIDAZOLAM BOLUS VIA INFUSION
1.0000 mg | INTRAVENOUS | Status: DC | PRN
  Filled 2021-03-07: qty 2

## 2021-03-07 MED ORDER — POLYETHYLENE GLYCOL 3350 17 G PO PACK
17.0000 g | PACK | Freq: Every day | ORAL | Status: DC
  Administered 2021-03-07 – 2021-03-08 (×2): 17 g
  Filled 2021-03-07 (×2): qty 1

## 2021-03-07 MED ORDER — ACETAMINOPHEN 325 MG PO TABS
650.0000 mg | ORAL_TABLET | Freq: Four times a day (QID) | ORAL | Status: DC | PRN
  Administered 2021-03-07: 650 mg
  Filled 2021-03-07: qty 2

## 2021-03-07 MED ORDER — FENTANYL 2500MCG IN NS 250ML (10MCG/ML) PREMIX INFUSION
50.0000 ug/h | INTRAVENOUS | Status: DC
  Administered 2021-03-07: 100 ug/h via INTRAVENOUS
  Filled 2021-03-07 (×2): qty 250

## 2021-03-07 MED ORDER — MIDAZOLAM 50MG/50ML (1MG/ML) PREMIX INFUSION
0.0000 mg/h | INTRAVENOUS | Status: DC
  Administered 2021-03-07: 2 mg/h via INTRAVENOUS
  Administered 2021-03-07 (×2): 8 mg/h via INTRAVENOUS
  Administered 2021-03-08: 7 mg/h via INTRAVENOUS
  Filled 2021-03-07 (×4): qty 50

## 2021-03-07 NOTE — Progress Notes (Signed)
NAME:  Cassandra Riley, MRN:  [id number], DOB:  1956-01-09, LOS: 3 ADMISSION DATE:  02/26/2021, CONSULTATION DATE: 03/04/2021 REFERRING MD:  Dr. Naida Sleight, [facility] CHIEF COMPLAINT:  AMS   History of Present Illness:  65 yo female smoker presented to [facility] on 3/15 with respiratory distress from COPD exacerbation.  She was also found to have acute systolic CHF with elevated troponin and transferred to [facility] on 3/16.  Developed worsening mental status and respiratory distress.  Required intubation and transferred to ICU.  Pertinent  Medical History  COPD on 3 liters O2, combined CHF, HTN, Chronic benzo use  Significant Hospital Events:  3/16 transfer from [facility], cardiology consulted 3/17 VDRF, transfer to ICU 3/20 change to versed gtt  Interim History / Subjective:  Agitated overnight.  Needing increased amounts of sedation.  Objective   Blood pressure 114/73, pulse 60, temperature (!) 102 F (38.9 C), temperature source Oral, resp. rate 16, height _0  (1.575 m), weight 53.1 kg, SpO2 97 %.    Vent Mode: PRVC FiO2 (%):  [40 %] 40 % Set Rate:  [16 bmp] 16 bmp Vt Set:  [350 mL] 350 mL PEEP:  [5 cmH20] 5 cmH20 Plateau Pressure:  [14 cmH20-28 cmH20] 20 cmH20   Intake/Output Summary (Last 24 hours) at 03/07/2021 0757 Last data filed at 03/07/2021 3474 Gross per 24 hour  Intake 3310.62 ml  Output 570 ml  Net 2740.62 ml   Filed Weights   03/05/21 0420 03/06/21 0500 03/07/21 0500  Weight: 51.4 kg 49.3 kg 53.1 kg    Examination:  General - sedated Eyes - pupils reactive ENT - ETT in place Cardiac - regular rate/rhythm, no murmur Chest - diffuse b/l rhonchi Abdomen - soft, non tender, + bowel sounds Extremities - no cyanosis, clubbing, or edema Skin - no rashes Neuro - RASS -3  Resolved Hospital Problem list     Active Hospital Problem list   Acute on chronic hypoxic/hypercapnic respiratory failure from COPD exacerbation. Tobacco abuse. Data:  ETT 3/17 >> Zithromax 3/17 >>  3/20 Rocephin 3/20 >>    Plan:  - not able to tolerate vent weaning at this time - continue brovana, yupelri, pulmicort - prn xopenex - continue solumedrol  not able to tolerate vent weaning at this time - continue brovana, yupelri, pulmicort - prn xopenex - continue solumedrol 40 mg bid - f/u CXR - nicotine patch - day 4 of ABx; change to rocephin 2/59   Acute systolic CHF with elevated troponin likely from demand ischemia. Acute on chronic cor pulmonale. Hx of HTN. Data:  Echo 3/17 >> EF 20 to 25%, grade 1 DD, severe RV systolic dysfx   Plan:  - felt to not be candidate for coronary interventions per cardiology; signed off 3/18 - wean pressors to keep MAP > 65 - hold outpt lasix, HCTZ    Acute metabolic encephalopathy from hypoxia/hypercapnia. Chronic benzodiazepine use. Data:  EEG 3/17 >> generalized slowing   Plan:  - mild elevation in ammonia; doubt this is cause of AMS - RASS goal 0 to -1 - change to versed and fentanyl gtt - continue chronulac 30 mg daily - continue prozac, remeron    Oliguria with hematuria with fever. Data:  Urine 3/19 >> Rocephin 3/20   Plan:  - continue IV fluids - f/u BMET - check urine culture - hold SQ heparin for now - add rocephin for possible UTI while awaiting culture results    Goals of care. Data:     Plan:  -  palliative care consulted     Best practice (evaluated daily)  Diet: tube feeds DVT prophylaxis: SCDs GI prophylaxis: protonix Mobility: bed rest Code Status: full code Disposition: ICU Family: updated pt's daughter on 3/19  Labs    CMP Latest Ref Rng & Units 03/07/2021 03/06/2021 03/05/2021  Glucose 70 - 99 mg/dL 185(H) 117(H) 90  BUN 8 - 23 mg/dL 31(H) 28(H) 28(H)  Creatinine 0.44 - 1.00 mg/dL 0.64 0.69 0.82  Sodium 135 - 145 mmol/L 140 141 142  Potassium 3.5 - 5.1 mmol/L 5.3(H) 4.7 4.6  Chloride 98 - 111 mmol/L 99 97(L) 96(L)  CO2 22 - 32 mmol/L 35(H) 37(H) 39(H)  Calcium 8.9 - 10.3 mg/dL  8.5(L) 8.5(L) 8.2(L)  Total Protein 6.5 - 8.1 g/dL - - -  Total Bilirubin 0.3 - 1.2 mg/dL - - -  Alkaline Phos 38 - 126 U/L - - -  AST 15 - 41 U/L - - -  ALT 0 - 44 U/L - - -    CBC Latest Ref Rng & Units 03/07/2021 03/06/2021 03/05/2021  WBC 4.0 - 10.5 K/uL 11.1(H) 9.8 4.8  Hemoglobin 12.0 - 15.0 g/dL 12.3 12.3 11.1(L)  Hematocrit 36.0 - 46.0 % 41.5 39.6 36.2  Platelets 150 - 400 K/uL 218 220 165    ABG    Component Value Date/Time   PHART 7.520 (H) 03/04/2021 1841   PCO2ART 56.0 (H) 03/04/2021 1841   PO2ART 126 (H) 03/04/2021 1841   HCO3 46.3 (H) 03/04/2021 1841   TCO2 48 (H) 03/04/2021 1841   O2SAT 99.0 03/04/2021 1841    CBG (last 3)  Recent Labs    03/06/21 2334 03/07/21 0326 03/07/21 0710  GLUCAP 120* 206* 150*    Critical care time: 32 minutes  Chesley Mires, MD [place] Pager - [phone] 03/07/2021, 7:57 AM

## 2021-03-07 NOTE — Progress Notes (Signed)
[place] Progress Note Patient Name: Cassandra Riley DOB: 06-08-1956 MRN: [id number]   Date of Service  03/07/2021  HPI/Events of Note  Agitation - Nursing request for bilateral soft wrist restraints.   eICU Interventions  Will order bilateral soft wrist restraints X 12 hours.     Intervention Category Major Interventions: Delirium, psychosis, severe agitation - evaluation and management  Willmar Stockinger [place] 03/07/2021, 7:50 PM

## 2021-03-08 ENCOUNTER — Inpatient Hospital Stay (HOSPITAL_COMMUNITY): Payer: 59

## 2021-03-08 DIAGNOSIS — R579 Shock, unspecified: Secondary | ICD-10-CM

## 2021-03-08 DIAGNOSIS — J441 Chronic obstructive pulmonary disease with (acute) exacerbation: Secondary | ICD-10-CM | POA: Diagnosis not present

## 2021-03-08 DIAGNOSIS — F1320 Sedative, hypnotic or anxiolytic dependence, uncomplicated: Secondary | ICD-10-CM | POA: Diagnosis not present

## 2021-03-08 DIAGNOSIS — Z515 Encounter for palliative care: Secondary | ICD-10-CM

## 2021-03-08 DIAGNOSIS — I5033 Acute on chronic diastolic (congestive) heart failure: Secondary | ICD-10-CM | POA: Diagnosis not present

## 2021-03-08 DIAGNOSIS — Z7189 Other specified counseling: Secondary | ICD-10-CM | POA: Diagnosis not present

## 2021-03-08 LAB — POCT I-STAT 7, (LYTES, BLD GAS, ICA,H+H)
Acid-Base Excess: 2 mmol/L (ref 0.0–2.0)
Bicarbonate: 34.5 mmol/L — ABNORMAL HIGH (ref 20.0–28.0)
Calcium, Ion: 1.22 mmol/L (ref 1.15–1.40)
HCT: 42 % (ref 36.0–46.0)
Hemoglobin: 14.3 g/dL (ref 12.0–15.0)
O2 Saturation: 100 %
Patient temperature: 97.7
Potassium: 4.2 mmol/L (ref 3.5–5.1)
Sodium: 139 mmol/L (ref 135–145)
TCO2: 37 mmol/L — ABNORMAL HIGH (ref 22–32)
pCO2 arterial: 98.3 mmHg (ref 32.0–48.0)
pH, Arterial: 7.15 — CL (ref 7.350–7.450)
pO2, Arterial: 372 mmHg — ABNORMAL HIGH (ref 83.0–108.0)

## 2021-03-08 LAB — CBC
HCT: 39.2 % (ref 36.0–46.0)
Hemoglobin: 11.8 g/dL — ABNORMAL LOW (ref 12.0–15.0)
MCH: 26.8 pg (ref 26.0–34.0)
MCHC: 30.1 g/dL (ref 30.0–36.0)
MCV: 89.1 fL (ref 80.0–100.0)
Platelets: 194 10*3/uL (ref 150–400)
RBC: 4.4 MIL/uL (ref 3.87–5.11)
RDW: 23.4 % — ABNORMAL HIGH (ref 11.5–15.5)
WBC: 11.1 10*3/uL — ABNORMAL HIGH (ref 4.0–10.5)
nRBC: 0 % (ref 0.0–0.2)

## 2021-03-08 LAB — BASIC METABOLIC PANEL
Anion gap: 6 (ref 5–15)
BUN: 29 mg/dL — ABNORMAL HIGH (ref 8–23)
CO2: 32 mmol/L (ref 22–32)
Calcium: 8.2 mg/dL — ABNORMAL LOW (ref 8.9–10.3)
Chloride: 103 mmol/L (ref 98–111)
Creatinine, Ser: 0.57 mg/dL (ref 0.44–1.00)
GFR, Estimated: >60 mL/min (ref >60)
Glucose, Bld: 146 mg/dL — ABNORMAL HIGH (ref 70–99)
Potassium: 4.7 mmol/L (ref 3.5–5.1)
Sodium: 141 mmol/L (ref 135–145)

## 2021-03-08 LAB — GLUCOSE, CAPILLARY
Glucose-Capillary: 133 mg/dL — ABNORMAL HIGH (ref 70–99)
Glucose-Capillary: 233 mg/dL — ABNORMAL HIGH (ref 70–99)

## 2021-03-08 LAB — TROPONIN I (HIGH SENSITIVITY): Troponin I (High Sensitivity): 2046 ng/L (ref <18)

## 2021-03-08 MED ORDER — MIDAZOLAM HCL 2 MG/2ML IJ SOLN: 2.0000 mg | INTRAMUSCULAR | Status: DC | PRN

## 2021-03-08 MED ORDER — SODIUM BICARBONATE 8.4 % IV SOLN
100.0000 meq | Freq: Once | INTRAVENOUS | Status: AC
  Administered 2021-03-08: 100 meq via INTRAVENOUS

## 2021-03-08 MED ORDER — LORAZEPAM 2 MG/ML IJ SOLN
2.0000 mg | INTRAMUSCULAR | Status: AC
  Administered 2021-03-08: 2 mg via INTRAVENOUS
  Filled 2021-03-08: qty 1

## 2021-03-08 MED ORDER — PHENYLEPHRINE HCL-NACL 10-0.9 MG/250ML-% IV SOLN
25.0000 ug/min | INTRAVENOUS | Status: DC
  Filled 2021-03-08: qty 250

## 2021-03-08 MED ORDER — HYDROMORPHONE BOLUS VIA INFUSION
1.0000 mg | INTRAVENOUS | Status: DC | PRN
  Filled 2021-03-08: qty 1

## 2021-03-08 MED ORDER — AMIODARONE HCL IN DEXTROSE 360-4.14 MG/200ML-% IV SOLN: 60.0000 mg/h | INTRAVENOUS | Status: DC

## 2021-03-08 MED ORDER — AMIODARONE HCL IN DEXTROSE 360-4.14 MG/200ML-% IV SOLN
30.0000 mg/h | INTRAVENOUS | Status: DC
  Administered 2021-03-08: 30 mg/h via INTRAVENOUS
  Filled 2021-03-08: qty 200

## 2021-03-08 MED ORDER — MIDAZOLAM HCL 2 MG/2ML IJ SOLN
INTRAMUSCULAR | Status: AC
  Filled 2021-03-08: qty 2

## 2021-03-08 MED ORDER — GLYCOPYRROLATE 0.2 MG/ML IJ SOLN: 0.2000 mg | INTRAMUSCULAR | Status: DC | PRN

## 2021-03-08 MED ORDER — HALOPERIDOL LACTATE 5 MG/ML IJ SOLN: 2.5000 mg | INTRAMUSCULAR | Status: DC | PRN

## 2021-03-08 MED ORDER — SODIUM CHLORIDE 0.9 % IV SOLN: 250.0000 mL | INTRAVENOUS | Status: DC

## 2021-03-08 MED ORDER — MIDAZOLAM HCL 2 MG/2ML IJ SOLN
2.0000 mg | INTRAMUSCULAR | Status: DC | PRN
  Administered 2021-03-08 (×2): 2 mg via INTRAVENOUS
  Filled 2021-03-08: qty 2

## 2021-03-08 MED ORDER — LEVALBUTEROL HCL 0.63 MG/3ML IN NEBU: 0.6300 mg | INHALATION_SOLUTION | Freq: Four times a day (QID) | RESPIRATORY_TRACT | Status: DC | PRN

## 2021-03-08 MED ORDER — LORAZEPAM 2 MG/ML IJ SOLN
0.5000 mg/h | INTRAVENOUS | Status: DC
  Administered 2021-03-08: 0.5 mg/h via INTRAVENOUS
  Filled 2021-03-08: qty 25

## 2021-03-08 MED ORDER — LEVALBUTEROL HCL 0.63 MG/3ML IN NEBU
0.6300 mg | INHALATION_SOLUTION | Freq: Four times a day (QID) | RESPIRATORY_TRACT | Status: DC
  Administered 2021-03-08: 0.63 mg via RESPIRATORY_TRACT
  Filled 2021-03-08: qty 3

## 2021-03-08 MED ORDER — LORAZEPAM BOLUS VIA INFUSION
1.0000 mg | INTRAVENOUS | Status: DC | PRN
  Filled 2021-03-08: qty 1

## 2021-03-08 MED ORDER — GLYCOPYRROLATE 0.2 MG/ML IJ SOLN
0.2000 mg | INTRAMUSCULAR | Status: AC
  Administered 2021-03-08: 0.2 mg via INTRAVENOUS
  Filled 2021-03-08: qty 1

## 2021-03-08 MED ORDER — POLYVINYL ALCOHOL 1.4 % OP SOLN
1.0000 [drp] | Freq: Four times a day (QID) | OPHTHALMIC | Status: DC | PRN
Start: 2021-03-08 — End: 2021-03-08
  Filled 2021-03-08: qty 15

## 2021-03-08 MED ORDER — SODIUM CHLORIDE 0.9 % IV SOLN
0.5000 mg/h | INTRAVENOUS | Status: DC
  Administered 2021-03-08: 0.5 mg/h via INTRAVENOUS
  Filled 2021-03-08: qty 2.5

## 2021-03-08 MED ORDER — SODIUM CHLORIDE 1 G PO TABS
1.0000 g | ORAL_TABLET | Freq: Two times a day (BID) | ORAL | Status: DC
  Administered 2021-03-08: 1 g
  Filled 2021-03-08: qty 1

## 2021-03-19 NOTE — Procedures (Signed)
EEG Report Indication: Altered mental status--rule out ictal activity as an etiology  This study was recorded in the waking/drowsy vs encephalopathic state.  The duration of the study was 26 minutes.  Electrodes were placed according to the International 10/20 system.  Video was reviewed for clinical correlation as needed.   There is suspected to be some degree of waking organization during periods where there is muscle artifact indicative of a more alert state--there is a discernible though very attenuated anterior - posterior voltage and frequency gradient.  In the occipital leads there is a possible (definitive reactivity is not confirmed) but symmetric posterior dominant rhythm of approximately 6 hertz, which is significantly slower than expected for age, very poorly sustained and indistinct.  Anteriorly, is the expected pattern of faster frequency, lower voltage waveforms though in a significantly reduced amount as compared to normal, and typically overriding moderate amplitude delta slowing.   During the less alert (no muscle artifact/blink artifiact) there is the disappearance of the gradient, a general shift to slower frequencies, disappearance of the suspected posterior dominant rhythm, but no sleep architecture.   Hyperventilation: deferred Photic stimulation: deferred  There are no clear focal, paroxysmal or epileptiform abnormalities or interhemispheric asymmetries seen during this portion of the recording.   Impression:   This is an abnormal waking and drowsy study due to debatably present attenuated organization as described above, though there is definitive reactivity state change--these findings are most consistent with a mild-moderate diffuse encephalopathy.There are no clear focal or epileptiform abnormalities.

## 2021-03-19 NOTE — Progress Notes (Signed)
EEG complete - results pending.

## 2021-03-19 NOTE — Progress Notes (Signed)
Additional encounter-   Remained at bedside for duration of patient's extubation and assisted with symptom management. Patient extubated to room air. Required several boluses to achieve and maintain comfort. Comfort was achieved and family remains at bedside.  Patient will likely die within the next minutes-hours.   Mariana Kaufman, AGNP-C Palliative Medicine  Total time: 38 mins

## 2021-03-19 NOTE — Progress Notes (Signed)
eLink Physician-Brief Progress Note Patient Name: Cassandra Riley DOB: 1956/11/26 MRN: [id number]   Date of Service  Mar 11, 2021  HPI/Events of Note  Review of CXR s/p R IJ CVL placement reveals R IJ CVL tip in mid SVC, No pneumothorax.   eICU Interventions  OK to use R IJ CVL.      Intervention Category Major Interventions: Other:  Lysle Dingwall 03-11-21, 6:36 AM

## 2021-03-19 NOTE — Progress Notes (Signed)
Brief PCCM Progress Note  Alerted by E-Link patient was coding.  Immediately and urgently responded to bedside.  Chest compressions ongoing.  Reported wide-complex tachycardia.  Pads were on.  Defibrillator charged and 200 J shock delivered.  Initial rhythm after defibrillation was PEA.  Epinephrine was given peridefibrillation.  1 to 2 minutes after resuming compressions patient was noted to be grimacing, coughing.  Pulse check performed with palpable femoral pulse.  In SVT heart rate in 200s.  Amnio 300 have been prepared and given in the setting of SVT and presumed VT arrest.  Shortly thereafter heart rates slowed.  Appeared to be sinus rhythm.  She did had further slowing of heart rate to 20s to 30s.  Weak pulse was palpated.  Additional dose of epinephrine was given.  Heart rate responded.  Blood pressure is low with peripheral norepinephrine titrated up to 8, was coming down by time I was leaving the room.  Review of labs indicated normal potassium 1 hour prior.  Magnesium within normal limits 2 days prior.  Magnesium 2 g IV ordered and administered.  Hemoglobin stable from prior.  Repeat chest x-ray performed an hour before the event and shortly after event demonstrated worsening bilateral infiltrates particular on the right.  On exam, bilateral breath sounds are present.  No pneumothorax.  Elevated peak pressures with elevated peak toes indicative of both increased airways resistance as well is decreased compliance.  Review of chart indicates EF is down, 25%.  V. tach arrest: Unclear inciting event.  No obvious EKG changes post arrest.  Will cycle troponins.  Most likely sequela of structural heart disease, cardiomyopathy. --s/p amio bolus, infusion ordered (Qtc ~350) --Troponins --EKG without STEMI  Hypotension: peri-arrest. Suspect related to cardiac etiology. Appears volume up. CXR with worsening pulmonary edema to my eye. --Central line to be placed --Pressors, MAP > 65 --Co-oximetry,  suspect will need diuresis to improve hemodynamics  Acute on chronic hypercarbic respiratory failure: De-recruiting after arrest. ABG with worsened CO2 retention.  --Adjust ventilator settings to increase minute ventilation  Daughter updated on cardiac arrest, plan of care. She consented to CVC over phone.   CRITICAL CARE Performed by: Bonna Gains Nakeysha Pasqual   Total critical care time: 45 minutes  Critical care time was exclusive of separately billable procedures and treating other patients.  Critical care was necessary to treat or prevent imminent or life-threatening deterioration.  Critical care was time spent personally by me on the following activities: development of treatment plan with patient and/or surrogate as well as nursing, discussions with consultants, evaluation of patient's response to treatment, examination of patient, obtaining history from patient or surrogate, ordering and performing treatments and interventions, ordering and review of laboratory studies, ordering and review of radiographic studies, pulse oximetry and re-evaluation of patient's condition.

## 2021-03-19 NOTE — Death Summary Note (Signed)
Cassandra Riley was a 65 y.o. female smoker who presented to [facility] on 3/15 with respiratory distress from COPD exacerbation. She was also found to have acute systolic CHF with elevated troponin and transferred to [facility] on 3/16. Developed worsening mental status and respiratory distress. Required intubation and transferred to ICU.  She was transferred to [facility] on 3/16.  She was seen by cardiology.  Started on steroids, antibiotics, and bronchodilators.    She developed ventricular tachycardia and then PEA arrest on 3/21.  Palliative care consulted.  Family opted for DNR status and transition to comfort care.  She was extubated on 3/21 and expired at 14:04.  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

## 2021-03-19 NOTE — Procedures (Addendum)
Central Venous Catheter Insertion Procedure Note  Cassandra Riley  [id number]  1956-05-17  Date:17-Mar-2021  Time:6:11 AM   Provider Performing:Grace E Bowser   Procedure: Insertion of Non-tunneled Central Venous Catheter(36556)with US guidance (11941)    Indication(s) Medication administration  Consent Risks of the procedure as well as the alternatives and risks of each were explained to the patient and/or caregiver.  Consent for the procedure was obtained and is signed in the bedside chart  Anesthesia Topical only with 1% lidocaine   Timeout Verified patient identification, verified procedure, site/side was marked, verified correct patient position, special equipment/implants available, medications/allergies/relevant history reviewed, required imaging and test results available.  Sterile Technique Maximal sterile technique including full sterile barrier drape, hand hygiene, sterile gown, sterile gloves, mask, hair covering, sterile ultrasound probe cover (if used).  Procedure Description Area of catheter insertion was cleaned with chlorhexidine and draped in sterile fashion.   With real-time ultrasound guidance a 20cm central venous catheter was placed into the right internal jugular vein, to 14 cm using seldinger technique. Placement of guidewidire verified in real time with ultrasound x 2 views prior to dilation of vessel and placement of CVC. Nonpulsatile blood flow and easy flushing noted in all ports.  The catheter was sutured in place x 4, biopatch and sterile dressing applied.  Complications/Tolerance None; patient tolerated the procedure well. Chest X-ray verified placement of catheter, tip within SVC  EBL  Minimal  Specimen(s) None  [name] MSN, AGACNP-BC [place] [phone] If no answer, [phone] 17-Mar-2021, 6:13 AM

## 2021-03-19 NOTE — Progress Notes (Signed)
Pharmacist Heart Failure Core Measure Documentation  Assessment: Cassandra Riley has an EF documented as 20-25% on 03/04/21 by ECHO.  Rationale: Heart failure patients with left ventricular systolic dysfunction (LVSD) and an EF < 40% should be prescribed an angiotensin converting enzyme inhibitor (ACEI) or angiotensin receptor blocker (ARB) at discharge unless a contraindication is documented in the medical record.  This patient is not currently on an ACEI or ARB for HF.  This note is being placed in the record in order to provide documentation that a contraindication to the use of these agents is present for this encounter.  ACE Inhibitor or Angiotensin Receptor Blocker is contraindicated (specify all that apply)  _0   ACEI allergy AND ARB allergy _1   Angioedema _2   Moderate or severe aortic stenosis _3   Hyperkalemia _4   Hypotension _5   Renal artery stenosis _6   Worsening renal function, preexisting renal disease or dysfunction   Saraya Tirey D. Mina Marble, PharmD, BCPS, [place] March 13, 2021, 9:42 AM

## 2021-03-19 NOTE — Progress Notes (Signed)
[place] Progress Note Patient Name: Cassandra Riley DOB: 11-19-1956 MRN: [id number]   Date of Service  03-25-2021  HPI/Events of Note  Hypotension  - BP = 85/66 with MAP = 73 with Norepinephrine IV infusion at ceiling.   eICU Interventions  Plan: 1. Add Vasopressin IV infusion. Titrate to MAP >= 65.  2. Will notify ground team of need for CVL.     Intervention Category Major Interventions: Hypotension - evaluation and management  Johanna Stafford [place] 03/25/2021, 5:20 AM

## 2021-03-19 NOTE — Progress Notes (Signed)
Daily Progress Note   Patient Name: Cassandra Riley       Date: 2021/03/10 DOB: 1956/01/13  Age: 65 y.o. MRN#: [id number] Attending Physician: Chesley Mires, MD Primary Care Physician: Neale Burly, MD Admit Date: 02/25/2021  Reason for Consultation/Follow-up: Establishing goals of care  Subjective: Patient with PEA event over the weekend with prolonged CPR. Desatting on vent, acidotic, retaining great amount of CO2. Also appears to be some level of possible anoxic brain injury- patient posturing while I was in the room.  Met with daughter and son- patient valued her independence- would not want to be debilitated or vent dependent or live in a nursing facility. Ray- her son- shares that she did not want to be resuscitated.  We discussed continued aggressive care vs transition to full comfort.  Ray and Caryl Pina share readily that comfort measures only would be desired plan of care.   Review of Systems  Unable to perform ROS: Intubated    Length of Stay: 4  Current Medications: Scheduled Meds:  . chlorhexidine gluconate (MEDLINE KIT)  15 mL Mouth Rinse BID  . Chlorhexidine Gluconate Cloth  6 each Topical Daily  . glycopyrrolate  0.2 mg Intravenous NOW  . LORazepam  2 mg Intravenous NOW    Continuous Infusions: . sodium chloride Stopped (03/04/21 1605)  . HYDROmorphone    . LORazepam (ATIVAN) infusion      PRN Meds: acetaminophen, glycopyrrolate, haloperidol lactate, HYDROmorphone, LORazepam, polyvinyl alcohol  Physical Exam Vitals and nursing note reviewed.  Constitutional:      Appearance: She is toxic-appearing.  Skin:    Coloration: Skin is pale.  Neurological:     Comments: Posturing, does not respond or follow commands             Vital Signs: BP (!) 94/57   Pulse 66    Temp (!) 96.9 F (36.1 C) (Axillary)   Resp 16   Ht 5' 2" (1.575 m)   Wt 53.1 kg   SpO2 96%   BMI 21.41 kg/m  SpO2: SpO2: 96 % O2 Device: O2 Device: Ventilator O2 Flow Rate: O2 Flow Rate (L/min): 15 L/min  Intake/output summary:   Intake/Output Summary (Last 24 hours) at March 10, 2021 1114 Last data filed at 2021/03/10 0600 Gross per 24 hour  Intake 2200.69 ml  Output 715 ml  Net  1485.69 ml   LBM: Last BM Date: 02/28/2021 Baseline Weight: Weight: 50.5 kg Most recent weight: Weight: 53.1 kg       Palliative Assessment/Data: PPS: 10%      Patient Active Problem List   Diagnosis Date Noted  . Shock ([place])   . Hypoxia   . Metabolic encephalopathy   . Chronic respiratory failure with hypoxia ([place]) 02/17/2021  . Acute on chronic diastolic CHF (congestive heart failure) ([place]) 02/27/2021  . COPD with acute exacerbation ([place]) 03/01/2021  . Benzodiazepine dependence ([place]) 03/04/2021    Palliative Care Assessment & Plan   Patient Profile: 65 y.o. female  with past medical history of severe COPD on home supplemental oxygen, CHF admitted on 03/12/2021 with acute respiratory failure likely due to an acute COPD exacerbation.  She was also found to have a new reduction in LVEF ( EF 20%).  Patient was placed on BiPAP initially but unable to tolerate due to altered mental status.  She was then intubated on 03/04/2021 and transferred to the ICU.  Palliative team was consulted for goals of care and CODE STATUS conversation.  Assessment/Recommendations/Plan   Transition to full comfort measures only- due to her posturing and history of benzodiazepine dependence will start on lorazepam infusion for comfort as well as transition from fentanyl to hydromorphone- patient appears uncomfortable on fentanyl infusion -I am concerned we will need aggressive symptom management after extubation- low threshold to transition to propofol if needed  Extubate when patient comfort achieved and when family is  ready  Chaplain consult for EOL prayer per family request  Goals of Care and Additional Recommendations:  Limitations on Scope of Treatment: Full Comfort Care  Code Status:  DNR  Prognosis:   Hours - Days  Discharge Planning:  Anticipated Hospital Death  Care plan was discussed with patient's family.   Thank you for allowing the Palliative Medicine Team to assist in the care of this patient.  Total time: 63 mins  Greater than 50%  of this time was spent counseling and coordinating care related to the above assessment and plan.  Mariana Kaufman, AGNP-C Palliative Medicine   Please contact Palliative Medicine Team phone at [phone] for questions and concerns.

## 2021-03-19 NOTE — Progress Notes (Signed)
Wasted 40 mg of Fentanyl, 20 mg Ativan, and 20 mg Dilaudid in stericycle. RN Alver Fisher witnessed.

## 2021-03-19 NOTE — Progress Notes (Signed)
Patient extubated per withdrawal of care per family request.  Patient tolerated well.  No complications.

## 2021-03-19 NOTE — Progress Notes (Signed)
   03-14-21 0432  Clinical Encounter Type  Visited With Patient not available  Visit Type Code  Referral From Nurse  Consult/Referral To Chaplain   Chaplain responded to Code Blue. Pt unavailable and no family present. Chaplain remains available.   This note was prepared by Chaplain Resident, Dante Gang, MDiv. Chaplain remains available as needed through the on-call pager: [phone].

## 2021-03-19 NOTE — Progress Notes (Signed)
ABG collected and results given to [facility].

## 2021-03-19 NOTE — Progress Notes (Signed)
NAME:  Cassandra Riley, MRN:  [id number], DOB:  02/05/1956, LOS: 4 ADMISSION DATE:  02/28/2021, CONSULTATION DATE:  03/04/2021 REFERRING MD:  Dr. Doreene Eland - TRH, CHIEF COMPLAINT:  AMS   History of Present Illness:  65 yo female smoker presented to [facility] on 3/15 with respiratory distress from COPD exacerbation.  She was also found to have acute systolic CHF with elevated troponin and transferred to [facility] on 3/16.  Developed worsening mental status and respiratory distress. Required intubation and transferred to ICU.  Pertinent  Medical History  COPD on 3 liters O2, combined CHF, HTN, Chronic benzo use  Significant Hospital Events: Including procedures, antibiotic start and stop dates in addition to other pertinent events    3/16 transfer from [facility], cardiology consulted  3/17 VDRF, transfer to ICU  3/20 change to versed gtt  Presumed VT arrest with conversion to PEA post defibrillation, CVL placed and pressors required post arrest   Interim History / Subjective:  Seen lying in bed with repetitive blinking  Seen spontaneously moving extremities   Objective   Blood pressure (!) 94/57, pulse 66, temperature (!) 96.9 F (36.1 C), temperature source Axillary, resp. rate 16, height _0  (1.575 m), weight 53.1 kg, SpO2 99 %.    Vent Mode: PRVC FiO2 (%):  [40 %-100 %] 40 % Set Rate:  [16 bmp-24 bmp] 24 bmp Vt Set:  [350 mL] 350 mL PEEP:  [5 cmH20] 5 cmH20 Plateau Pressure:  [15 cmH20-26 cmH20] 16 cmH20   Intake/Output Summary (Last 24 hours) at 04/06/2021 3845 Last data filed at 04-06-2021 0600 Gross per 24 hour  Intake 2837.25 ml  Output 715 ml  Net 2122.25 ml   Filed Weights   03/05/21 0420 03/06/21 0500 03/07/21 0500  Weight: 51.4 kg 49.3 kg 53.1 kg    Examination: General: Chronically ill appearing deconditioned elderly female lying in bed on mechanical ventilation, in NAD HEENT: ETT, MM pink/moist, PERRL,  Neuro: Spontaneously moving but nonresponsvie to commands  CV: s1s2  regular rate and rhythm, no murmur, rubs, or gallops,  PULM:  Bilateral expiratory wheeze, course breath sounds, tolerating vent  GI: soft, bowel sounds active in all 4 quadrants, non-tender, non-distended, tolerating TF Extremities: warm/dry, no edema  Skin: no rashes or lesions  Labs/imaging personally reviewed    CXR 3/21 > worsened bilateral opacities throughout both lungs concerning for asymmetric pulmonary edema   Resolved Hospital Problem list     Assessment & Plan:  Acute on chronic hypoxic and  hypercapnic respiratory failure secondary to COPD exacerbations  Tobacco abuse -Required intubation 3/17 and received empiric Azithromycin 3/17 > 3/20 P: Continue ventilator support with lung protective strategies  Wean PEEP and FiO2 for sats greater than 90%. Head of bed elevated 30 degrees. Plateau pressures less than 30 cm H20.  Follow intermittent chest x-ray and ABG.   SAT/SBT as tolerated, mentation preclude extubation  Ensure adequate pulmonary hygiene  Follow cultures  VAP bundle in place  PAD protocol Continue BDs Continue Brovana, Yupelin, and Pulmicort  Nicotine patch  Continue IV Rocephin  Continue IV steroids   In hospital VT arrest -Arrest time approximately 15 mins with on defibrilation, 2 rounds of EPI, mag, and amiodarone givem  Acute systolic CHF with elevated troponin likely from demand ischemia. Acute on chronic cor pulmonale. Hx of HTN. -Echo 3/17 >> EF 20 to 25%, grade 1 DD, severe RV systolic dysfx -Felt to not be candidate for coronary interventions per cardiology; signed off 3/18 P: Reconsult cardiology  Continue pressor support for MAP goal greater than 65 Home lasix and HCTZ on hold Strict intake and output  Continuous telemetry  Daily weight  Vent support as above  Continue IV amiodarone   Acute metabolic encephalopathy from hypoxia/hypercapnia. Chronic benzodiazepine use. -EEG 3/17 with generalized slowing, mild elevation in ammonia;  doubt this is cause of AMS P: RASS goal 0 to -1 Minimize sedation Delirium precaution  Continue home Prozac, Lactulose, and Remeron  Home Ativan on hold  Repeat EEG now  PRN Versed  Oliguria with hematuria with fever. P: Continue IV Rocephin Follow renal function / urine output Trend Bmet Avoid nephrotoxins Ensure adequate renal perfusion   GOC Spoke with patients daughter and son at bedside, they report that the initial hope was for short term vent support for presumed COPD excerbatin but now that patient has supper cardiac arrest with significantly reduced cardiac function they are considering changing GOC. They state that the patient would not want prolonged life support. Will reach back out to palliative care team today.    Best practice (evaluated daily)  Diet:  Tube Feed  Pain/Anxiety/Delirium protocol (if indicated): Yes (RASS goal -1) VAP protocol (if indicated): Yes DVT prophylaxis: Subcutaneous Heparin on hold due to hematuria  GI prophylaxis: PPI Glucose control:  SSI Yes Central venous access:  Yes, and it is still needed Arterial line:  N/A Foley:  Yes, and it is still needed Mobility:  bed rest  PT consulted: N/A Last date of multidisciplinary goals of care discussion 03-17-2021 Code Status:  full code Disposition: ICU     Critical care time:   CRITICAL CARE Performed by: Johnsie Cancel  Total critical care time: 42 minutes  Critical care time was exclusive of separately billable procedures and treating other patients.  Critical care was necessary to treat or prevent imminent or life-threatening deterioration.  Critical care was time spent personally by me on the following activities: development of treatment plan with patient and/or surrogate as well as nursing, discussions with consultants, evaluation of patient's response to treatment, examination of patient, obtaining history from patient or surrogate, ordering and performing treatments and  interventions, ordering and review of laboratory studies, ordering and review of radiographic studies, pulse oximetry and re-evaluation of patient's condition.  Johnsie Cancel, NP-C [place] Pulmonary & Critical Care Personal contact information can be found on Amion  If no response please page: Adult pulmonary and critical care medicine pager on Amion unitl 7pm After 7pm please call [phone] 03-17-21, 8:15 AM

## 2021-03-19 NOTE — Progress Notes (Signed)
This chaplain responded to PMT referral for family spiritual care before transitioning the Pt. to comfort measures and extubating. The chaplain appreciates the RN-Jamie's update.  The Pt. son-Ray and daughter-Ashley are at the Pt. bedside.  The silence is sacred in the room.  The family allows the chaplain to hold the Pt. hand. Upon the family's request prayer is shared with the hope of bringing peace and comfort to the Pt.  This chaplain is available for F/U spiritual care as needed.

## 2021-03-19 DEATH — deceased

## 2021-03-20 MED FILL — Medication: Qty: 1 | Status: AC

## 2022-03-28 IMAGING — DX DG CHEST 1V PORT
2 series · 2 of 2 positions shown · non-contrast
Comparison: 03/02/2021

CLINICAL DATA: Hypoxia short of breath

EXAM:
PORTABLE CHEST 1 VIEW

[chest ap (1 of 2)]
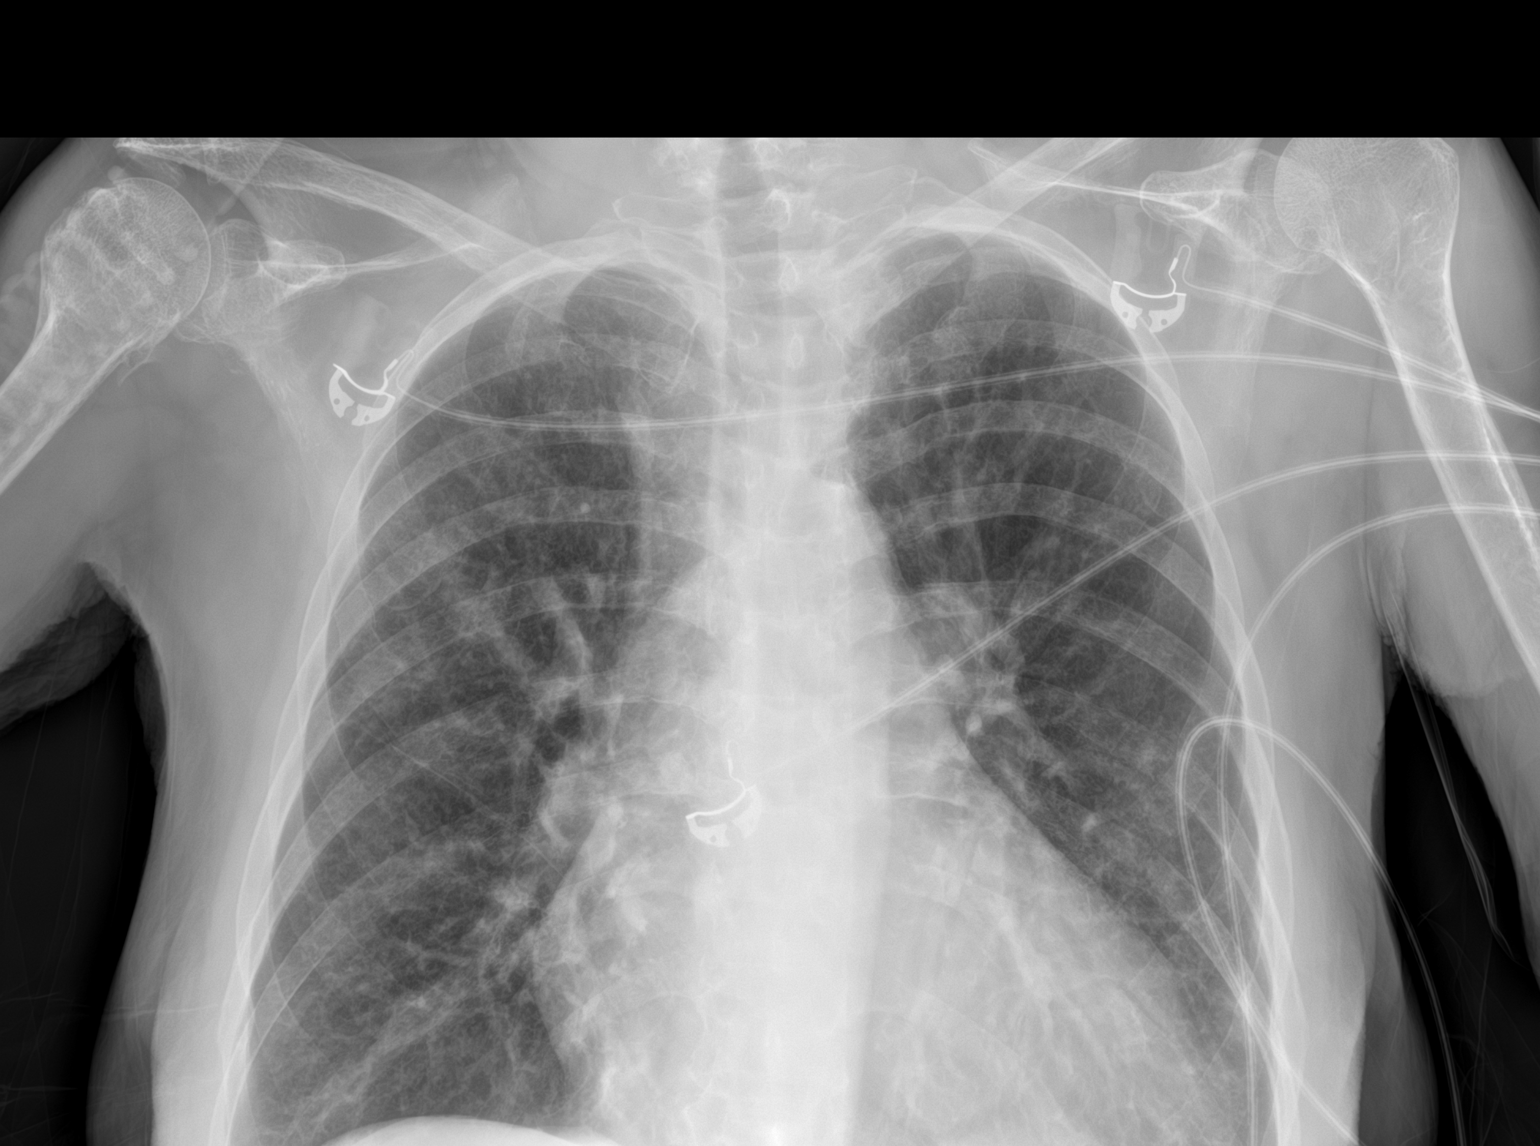

[chest ap (2 of 2)]
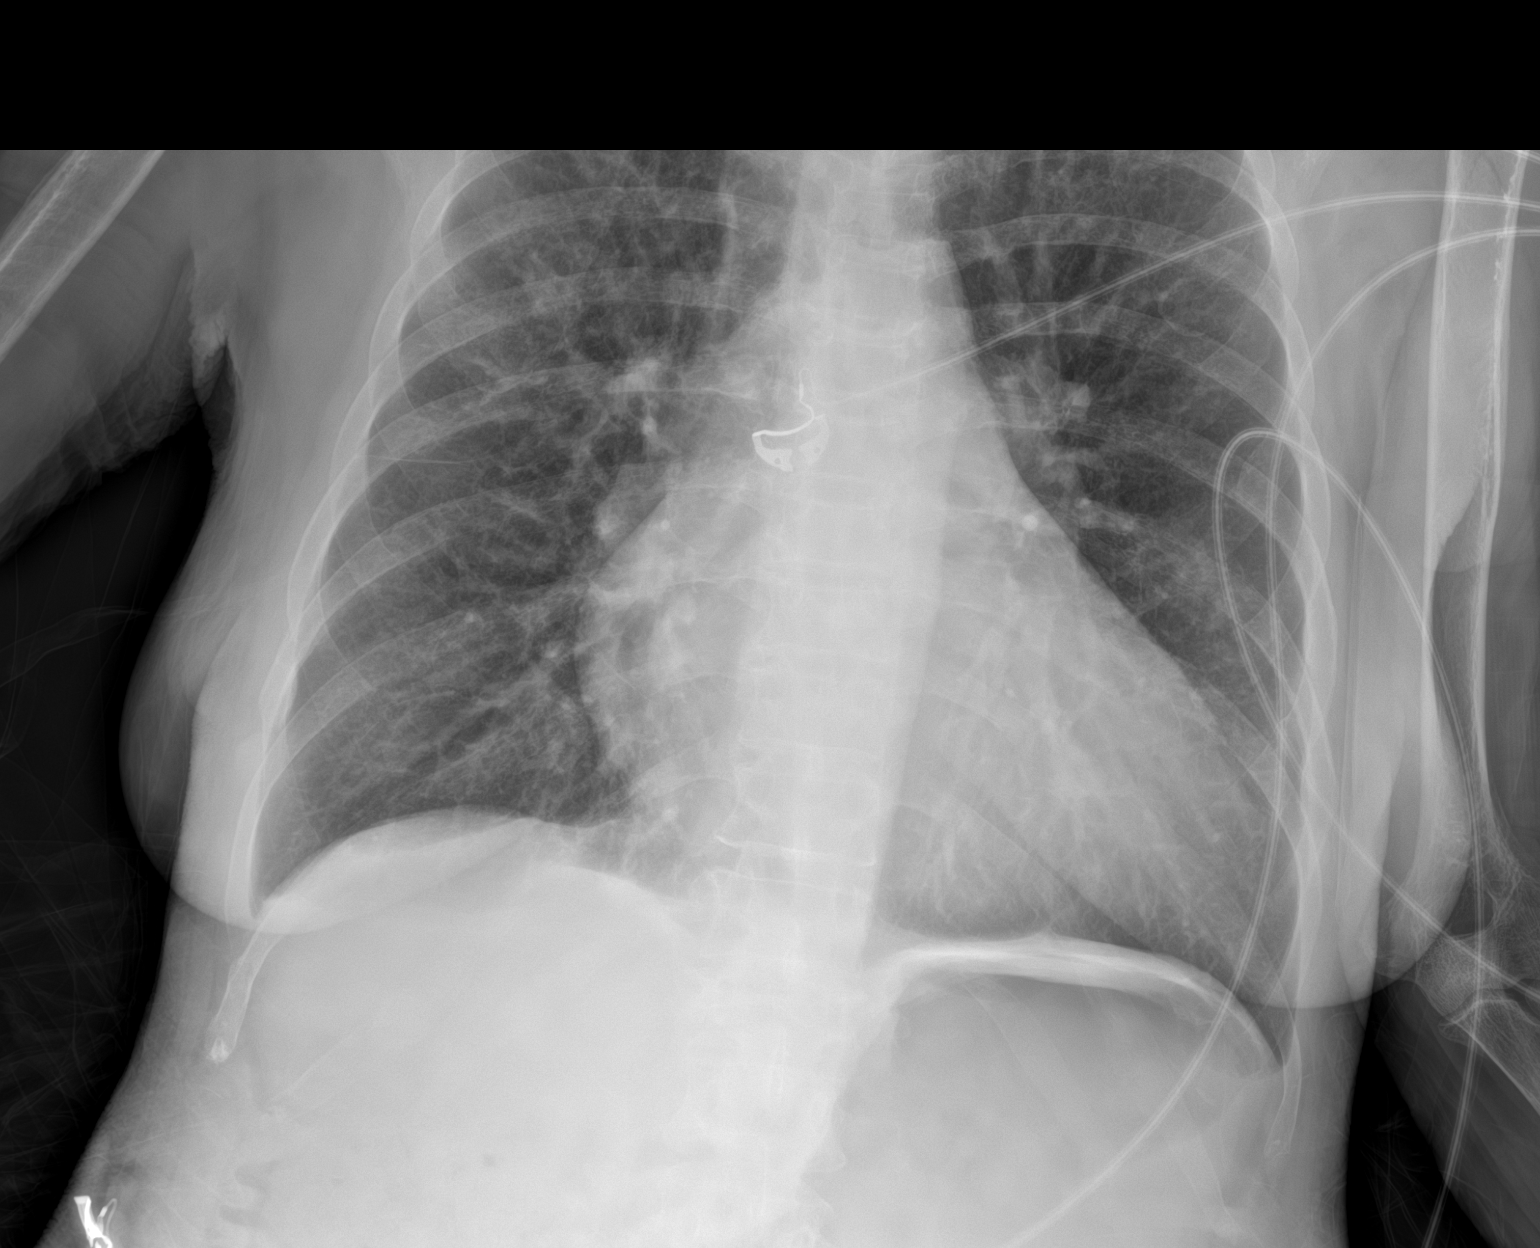

[2 of 2 positions shown; findings below may reference images not displayed]

FINDINGS: Cardiomegaly with vascular congestion. No consolidation or pleural
effusion. No pneumothorax. Chronic deformity at the right humeral
head.
IMPRESSION: Cardiomegaly with vascular congestion.

## 2022-03-28 IMAGING — DX DG CHEST 1V PORT
1 series · 1 of 1 positions shown · non-contrast
Comparison: Radiograph earlier this day.

CLINICAL DATA: Endotracheal and orogastric tube placement.

EXAM:
PORTABLE CHEST 1 VIEW

[chest]
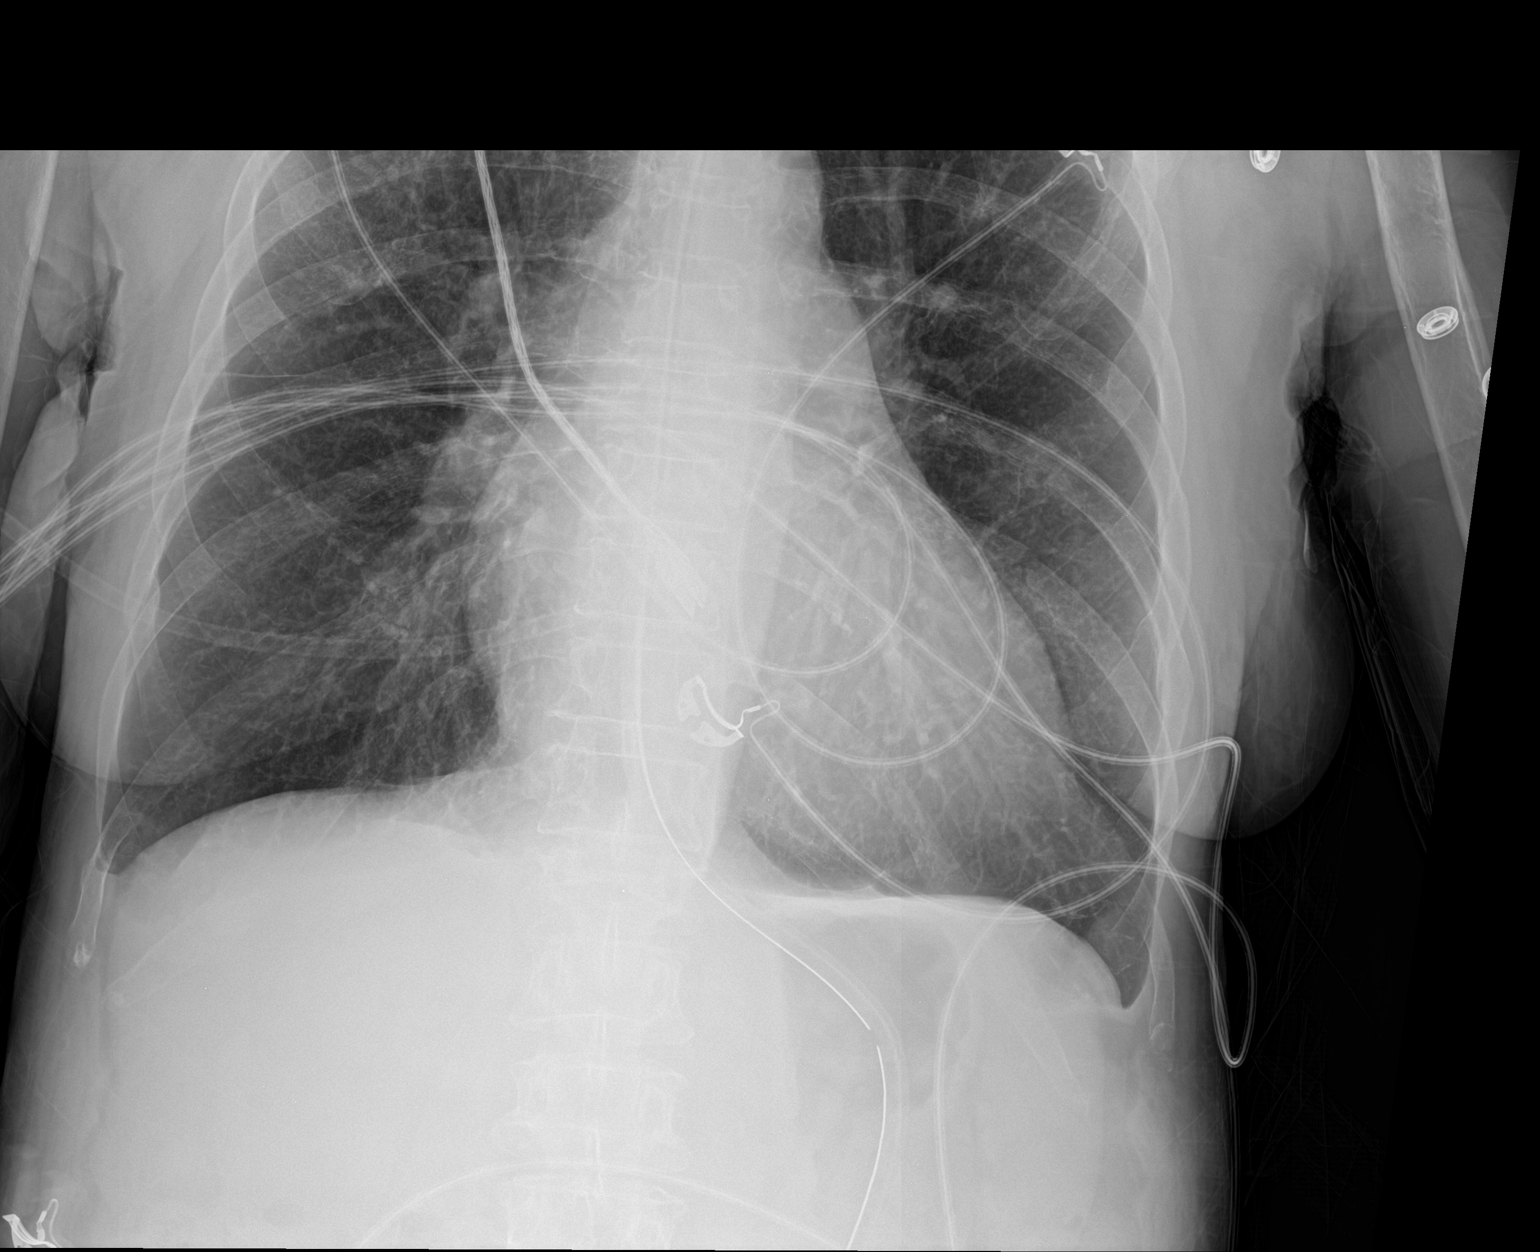

[1 of 1 positions shown; findings below may reference images not displayed]

FINDINGS: The apices are not included in the field of view, not repeated at
the request of the referring provider. Endotracheal tube tip is
approximately 3.4 cm from the carina. Tip and side port of the
enteric tube below the diaphragm in the stomach. Cardiomegaly is
less prominent than on earlier today. Improved vascular congestion.
Chronic bronchial thickening and hyperinflation. No new pulmonary
opacity. Remote left rib fractures.
IMPRESSION: 1. Endotracheal tube tip approximately 3.4 cm from the carina.
Enteric tube tip and side-port below the diaphragm in the stomach.
2. Hyperinflation and bronchial thickening suggesting COPD.
3. Improved cardiomegaly from earlier today.

## 2022-03-30 IMAGING — DX DG CHEST 1V PORT
1 series · 1 of 1 positions shown · non-contrast
Comparison: 03/05/2021

CLINICAL DATA: Respiratory failure

EXAM:
PORTABLE CHEST 1 VIEW

[chest ap]
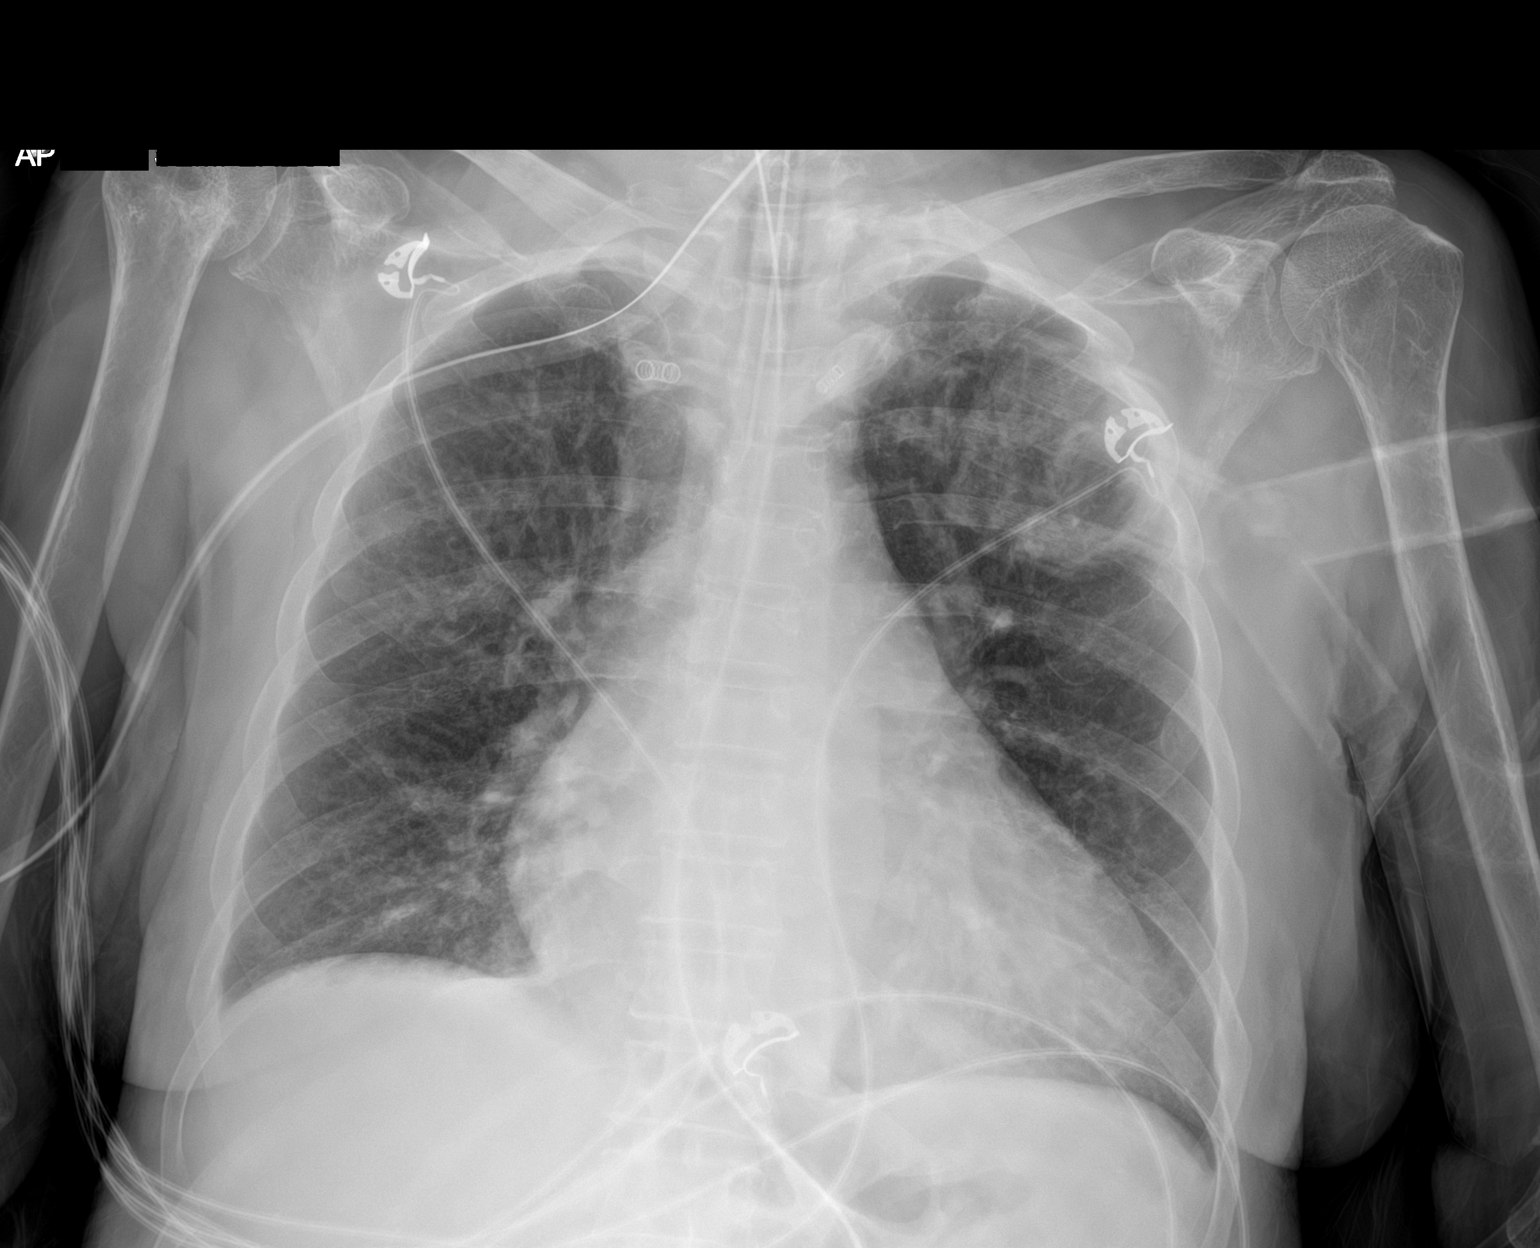

[1 of 1 positions shown; findings below may reference images not displayed]

FINDINGS: Endotracheal tube tip terminates in the mid trachea 3.3 cm from the
carina. Transesophageal tube tip and side port terminate below the
GE junction. Telemetry leads and external support devices overlie
the chest. Some coarsened interstitial opacities and bronchitic
features are again seen likely accentuated by low volumes and
atelectasis. No pneumothorax. Suspect a trace left effusion. No
right effusion. Cardiomediastinal contours are similar to priors
accounting for differences in technique. No acute osseous or soft
tissue abnormality. Degenerative changes are present in the imaged
spine and shoulders.
IMPRESSION: 1. Diminished lung volumes and atelectasis. Likely trace left
effusion.
